# Patient Record
Sex: Female | Born: 1970
Health system: Southern US, Community
[De-identification: ages and names within clinical notes are randomized; demographics above are authoritative.]

## PROBLEM LIST (undated history)

## (undated) DIAGNOSIS — G902 Horner's syndrome: Secondary | ICD-10-CM

## (undated) DIAGNOSIS — D3610 Benign neoplasm of peripheral nerves and autonomic nervous system, unspecified: Secondary | ICD-10-CM

## (undated) DIAGNOSIS — Z87891 Personal history of nicotine dependence: Secondary | ICD-10-CM

## (undated) DIAGNOSIS — N921 Excessive and frequent menstruation with irregular cycle: Secondary | ICD-10-CM

## (undated) DIAGNOSIS — N760 Acute vaginitis: Secondary | ICD-10-CM

## (undated) DIAGNOSIS — D649 Anemia, unspecified: Secondary | ICD-10-CM

## (undated) DIAGNOSIS — D219 Benign neoplasm of connective and other soft tissue, unspecified: Secondary | ICD-10-CM

## (undated) DIAGNOSIS — Z8669 Personal history of other diseases of the nervous system and sense organs: Secondary | ICD-10-CM

## (undated) DIAGNOSIS — F329 Major depressive disorder, single episode, unspecified: Secondary | ICD-10-CM

## (undated) DIAGNOSIS — F32A Depression, unspecified: Secondary | ICD-10-CM

## (undated) DIAGNOSIS — B9689 Other specified bacterial agents as the cause of diseases classified elsewhere: Secondary | ICD-10-CM

## (undated) HISTORY — DX: Acute vaginitis: N76.0

## (undated) HISTORY — DX: Excessive and frequent menstruation with irregular cycle: N92.1

## (undated) HISTORY — DX: Anemia, unspecified: D64.9

## (undated) HISTORY — DX: Acute vaginitis: B96.89

## (undated) HISTORY — PX: TUMOR REMOVAL: SHX12

## (undated) HISTORY — DX: Personal history of other diseases of the nervous system and sense organs: Z86.69

## (undated) HISTORY — PX: OOPHORECTOMY: SHX86

## (undated) HISTORY — PX: ABDOMINOPLASTY: SUR9

## (undated) HISTORY — DX: Benign neoplasm of connective and other soft tissue, unspecified: D21.9

## (undated) HISTORY — DX: Horner's syndrome: G90.2

## (undated) HISTORY — DX: Benign neoplasm of peripheral nerves and autonomic nervous system, unspecified: D36.10

## (undated) HISTORY — DX: Major depressive disorder, single episode, unspecified: F32.9

## (undated) HISTORY — DX: Depression, unspecified: F32.A

## (undated) HISTORY — DX: Personal history of nicotine dependence: Z87.891

---

## 2000-11-20 ENCOUNTER — Other Ambulatory Visit: Admission: RE | Admit: 2000-11-20 | Discharge: 2000-11-20 | Payer: Self-pay | Admitting: *Deleted

## 2000-11-25 ENCOUNTER — Inpatient Hospital Stay (HOSPITAL_COMMUNITY): Admission: AD | Admit: 2000-11-25 | Discharge: 2000-11-25 | Payer: Self-pay | Admitting: Obstetrics

## 2000-12-31 ENCOUNTER — Encounter: Admission: RE | Admit: 2000-12-31 | Discharge: 2000-12-31 | Payer: Self-pay | Admitting: Obstetrics

## 2001-12-30 ENCOUNTER — Inpatient Hospital Stay (HOSPITAL_COMMUNITY): Admission: AD | Admit: 2001-12-30 | Discharge: 2001-12-30 | Payer: Self-pay | Admitting: *Deleted

## 2004-02-08 ENCOUNTER — Ambulatory Visit: Payer: Self-pay | Admitting: Internal Medicine

## 2004-02-15 ENCOUNTER — Ambulatory Visit: Payer: Self-pay | Admitting: Internal Medicine

## 2004-07-11 ENCOUNTER — Ambulatory Visit: Payer: Self-pay | Admitting: Internal Medicine

## 2004-12-11 ENCOUNTER — Emergency Department (HOSPITAL_COMMUNITY): Admission: EM | Admit: 2004-12-11 | Discharge: 2004-12-11 | Payer: Self-pay | Admitting: Emergency Medicine

## 2004-12-16 ENCOUNTER — Ambulatory Visit: Payer: Self-pay | Admitting: Internal Medicine

## 2004-12-25 ENCOUNTER — Ambulatory Visit: Payer: Self-pay | Admitting: Internal Medicine

## 2005-06-20 ENCOUNTER — Ambulatory Visit: Payer: Self-pay | Admitting: Internal Medicine

## 2005-07-11 ENCOUNTER — Encounter (INDEPENDENT_AMBULATORY_CARE_PROVIDER_SITE_OTHER): Payer: Self-pay | Admitting: Internal Medicine

## 2005-07-15 ENCOUNTER — Ambulatory Visit: Payer: Self-pay | Admitting: Internal Medicine

## 2005-08-18 ENCOUNTER — Ambulatory Visit: Payer: Self-pay | Admitting: Internal Medicine

## 2005-08-20 ENCOUNTER — Ambulatory Visit: Payer: Self-pay | Admitting: Internal Medicine

## 2005-08-25 ENCOUNTER — Encounter (INDEPENDENT_AMBULATORY_CARE_PROVIDER_SITE_OTHER): Payer: Self-pay | Admitting: Internal Medicine

## 2005-08-25 ENCOUNTER — Ambulatory Visit: Payer: Self-pay | Admitting: Internal Medicine

## 2005-08-29 ENCOUNTER — Ambulatory Visit: Payer: Self-pay | Admitting: Internal Medicine

## 2005-09-09 ENCOUNTER — Ambulatory Visit: Payer: Self-pay | Admitting: Internal Medicine

## 2005-09-10 ENCOUNTER — Encounter (HOSPITAL_COMMUNITY): Admission: RE | Admit: 2005-09-10 | Discharge: 2005-12-09 | Payer: Self-pay | Admitting: Family Medicine

## 2005-09-19 ENCOUNTER — Ambulatory Visit: Payer: Self-pay | Admitting: Internal Medicine

## 2005-09-24 ENCOUNTER — Ambulatory Visit (HOSPITAL_COMMUNITY): Admission: RE | Admit: 2005-09-24 | Discharge: 2005-09-24 | Payer: Self-pay | Admitting: Obstetrics & Gynecology

## 2005-09-29 ENCOUNTER — Encounter (INDEPENDENT_AMBULATORY_CARE_PROVIDER_SITE_OTHER): Payer: Self-pay | Admitting: Internal Medicine

## 2005-10-01 ENCOUNTER — Ambulatory Visit: Payer: Self-pay | Admitting: Internal Medicine

## 2005-10-02 ENCOUNTER — Ambulatory Visit: Payer: Self-pay | Admitting: Internal Medicine

## 2005-10-31 ENCOUNTER — Ambulatory Visit: Payer: Self-pay | Admitting: Internal Medicine

## 2005-11-20 ENCOUNTER — Ambulatory Visit: Payer: Self-pay | Admitting: Obstetrics and Gynecology

## 2006-03-17 ENCOUNTER — Encounter (INDEPENDENT_AMBULATORY_CARE_PROVIDER_SITE_OTHER): Payer: Self-pay | Admitting: Internal Medicine

## 2006-03-17 DIAGNOSIS — F329 Major depressive disorder, single episode, unspecified: Secondary | ICD-10-CM

## 2006-03-17 DIAGNOSIS — D509 Iron deficiency anemia, unspecified: Secondary | ICD-10-CM | POA: Insufficient documentation

## 2006-03-17 DIAGNOSIS — F32A Depression, unspecified: Secondary | ICD-10-CM | POA: Insufficient documentation

## 2006-06-08 ENCOUNTER — Encounter (INDEPENDENT_AMBULATORY_CARE_PROVIDER_SITE_OTHER): Payer: Self-pay | Admitting: Internal Medicine

## 2006-06-08 ENCOUNTER — Ambulatory Visit: Payer: Self-pay | Admitting: Internal Medicine

## 2006-06-08 LAB — CONVERTED CEMR LAB
CO2: 22 meq/L (ref 19–32)
Creatinine, Ser: 0.91 mg/dL (ref 0.40–1.20)
HCT: 33.3 % — ABNORMAL LOW (ref 36.0–46.0)
Hemoglobin: 11 g/dL — ABNORMAL LOW (ref 12.0–15.0)
MCHC: 33 g/dL (ref 30.0–36.0)
MCV: 84.5 fL (ref 78.0–100.0)
Platelets: 315 10*3/uL (ref 150–400)
RBC: 3.94 M/uL (ref 3.87–5.11)
Sodium: 137 meq/L (ref 135–145)

## 2006-07-06 ENCOUNTER — Encounter (INDEPENDENT_AMBULATORY_CARE_PROVIDER_SITE_OTHER): Payer: Self-pay | Admitting: Internal Medicine

## 2006-07-06 ENCOUNTER — Ambulatory Visit: Payer: Self-pay | Admitting: Internal Medicine

## 2006-07-06 DIAGNOSIS — J069 Acute upper respiratory infection, unspecified: Secondary | ICD-10-CM | POA: Insufficient documentation

## 2006-07-08 LAB — CONVERTED CEMR LAB
Hemoglobin: 10.8 g/dL — ABNORMAL LOW (ref 12.0–15.0)
RDW: 15.6 % — ABNORMAL HIGH (ref 11.5–14.0)

## 2006-07-13 ENCOUNTER — Encounter (INDEPENDENT_AMBULATORY_CARE_PROVIDER_SITE_OTHER): Payer: Self-pay | Admitting: Internal Medicine

## 2006-07-13 ENCOUNTER — Ambulatory Visit: Payer: Self-pay | Admitting: Internal Medicine

## 2006-07-13 LAB — CONVERTED CEMR LAB: RBC Folate: 566 ng/mL (ref 180–600)

## 2006-07-14 LAB — CONVERTED CEMR LAB
Ferritin: 23 ng/mL (ref 10–291)
Vitamin B-12: 1018 pg/mL — ABNORMAL HIGH (ref 211–911)

## 2006-11-09 ENCOUNTER — Ambulatory Visit: Payer: Self-pay | Admitting: Internal Medicine

## 2006-11-09 ENCOUNTER — Encounter (INDEPENDENT_AMBULATORY_CARE_PROVIDER_SITE_OTHER): Payer: Self-pay | Admitting: Internal Medicine

## 2006-11-09 DIAGNOSIS — IMO0002 Reserved for concepts with insufficient information to code with codable children: Secondary | ICD-10-CM | POA: Insufficient documentation

## 2006-11-10 DIAGNOSIS — N921 Excessive and frequent menstruation with irregular cycle: Secondary | ICD-10-CM | POA: Insufficient documentation

## 2006-11-10 LAB — CONVERTED CEMR LAB
HCT: 34.6 % — ABNORMAL LOW
Hemoglobin: 10.7 g/dL — ABNORMAL LOW
MCHC: 30.9 g/dL
MCV: 84.8 fL
Platelets: 302 K/uL
RBC: 4.08 M/uL
RDW: 14.9 % — ABNORMAL HIGH
WBC: 7 10*3/microliter

## 2006-11-23 ENCOUNTER — Ambulatory Visit: Payer: Self-pay | Admitting: Internal Medicine

## 2006-11-23 ENCOUNTER — Encounter (INDEPENDENT_AMBULATORY_CARE_PROVIDER_SITE_OTHER): Payer: Self-pay | Admitting: Internal Medicine

## 2006-11-23 LAB — CONVERTED CEMR LAB
HCT: 37.4 % (ref 36.0–46.0)
Hemoglobin: 11.2 g/dL — ABNORMAL LOW (ref 12.0–15.0)
MCV: 86.4 fL (ref 78.0–100.0)
Platelets: 371 10*3/uL (ref 150–400)
RBC: 4.33 M/uL (ref 3.87–5.11)
RDW: 15 % — ABNORMAL HIGH (ref 11.5–14.0)

## 2006-12-04 ENCOUNTER — Ambulatory Visit: Payer: Self-pay | Admitting: Obstetrics & Gynecology

## 2006-12-04 ENCOUNTER — Encounter: Payer: Self-pay | Admitting: Obstetrics & Gynecology

## 2006-12-07 ENCOUNTER — Ambulatory Visit (HOSPITAL_COMMUNITY): Admission: RE | Admit: 2006-12-07 | Discharge: 2006-12-07 | Payer: Self-pay | Admitting: Family Medicine

## 2006-12-09 ENCOUNTER — Ambulatory Visit: Payer: Self-pay | Admitting: Gynecology

## 2007-03-29 ENCOUNTER — Encounter (INDEPENDENT_AMBULATORY_CARE_PROVIDER_SITE_OTHER): Payer: Self-pay | Admitting: *Deleted

## 2007-03-29 ENCOUNTER — Ambulatory Visit: Payer: Self-pay | Admitting: Infectious Diseases

## 2007-03-29 ENCOUNTER — Ambulatory Visit (HOSPITAL_COMMUNITY): Admission: RE | Admit: 2007-03-29 | Discharge: 2007-03-29 | Payer: Self-pay | Admitting: Infectious Diseases

## 2007-03-29 DIAGNOSIS — R519 Headache, unspecified: Secondary | ICD-10-CM | POA: Insufficient documentation

## 2007-03-29 DIAGNOSIS — R51 Headache: Secondary | ICD-10-CM | POA: Insufficient documentation

## 2007-03-29 LAB — CONVERTED CEMR LAB
Basophils Absolute: 0 10*3/uL (ref 0.0–0.1)
Eosinophils Absolute: 0.1 10*3/uL (ref 0.0–0.7)
Hemoglobin: 11.9 g/dL — ABNORMAL LOW (ref 12.0–15.0)
Lymphocytes Relative: 30 % (ref 12–46)
Lymphs Abs: 2.1 10*3/uL (ref 0.7–3.3)
MCHC: 32.5 g/dL (ref 30.0–36.0)
Platelets: 268 10*3/uL (ref 150–400)
RBC: 4.29 M/uL (ref 3.87–5.11)
RDW: 17.8 % — ABNORMAL HIGH (ref 11.5–14.0)
WBC: 7 10*3/uL (ref 4.0–10.5)

## 2007-04-08 ENCOUNTER — Encounter (INDEPENDENT_AMBULATORY_CARE_PROVIDER_SITE_OTHER): Payer: Self-pay | Admitting: *Deleted

## 2007-04-08 ENCOUNTER — Ambulatory Visit: Payer: Self-pay | Admitting: *Deleted

## 2007-04-08 ENCOUNTER — Ambulatory Visit (HOSPITAL_COMMUNITY): Admission: RE | Admit: 2007-04-08 | Discharge: 2007-04-08 | Payer: Self-pay | Admitting: *Deleted

## 2007-04-08 LAB — CONVERTED CEMR LAB
ALT: 13 units/L (ref 0–35)
Albumin: 3.5 g/dL (ref 3.5–5.2)
Alkaline Phosphatase: 86 units/L (ref 39–117)
BUN: 12 mg/dL (ref 6–23)
Basophils Absolute: 0 10*3/uL (ref 0.0–0.1)
Basophils Relative: 0 % (ref 0–1)
Beta hcg, urine, semiquantitative: NEGATIVE
Bilirubin Urine: NEGATIVE
CO2: 25 meq/L (ref 19–32)
Creatinine, Ser: 0.73 mg/dL (ref 0.40–1.20)
Eosinophils Absolute: 0.1 10*3/uL (ref 0.0–0.7)
Glucose, Bld: 94 mg/dL (ref 70–99)
Ketones, ur: NEGATIVE mg/dL
Monocytes Absolute: 0.4 10*3/uL (ref 0.2–0.7)
Neutrophils Relative %: 70 % (ref 43–77)
Nitrite: NEGATIVE
Potassium: 4.1 meq/L (ref 3.5–5.3)
Total Protein: 7.6 g/dL (ref 6.0–8.3)
WBC: 8.4 10*3/uL (ref 4.0–10.5)

## 2007-04-21 ENCOUNTER — Encounter (INDEPENDENT_AMBULATORY_CARE_PROVIDER_SITE_OTHER): Payer: Self-pay | Admitting: *Deleted

## 2007-04-21 ENCOUNTER — Encounter (INDEPENDENT_AMBULATORY_CARE_PROVIDER_SITE_OTHER): Payer: Self-pay | Admitting: Internal Medicine

## 2007-04-21 ENCOUNTER — Ambulatory Visit: Payer: Self-pay | Admitting: Internal Medicine

## 2007-04-21 LAB — CONVERTED CEMR LAB
Bilirubin Urine: NEGATIVE
Ketones, ur: NEGATIVE mg/dL
Nitrite: NEGATIVE
Protein, ur: NEGATIVE mg/dL

## 2007-10-11 ENCOUNTER — Encounter: Payer: Self-pay | Admitting: Internal Medicine

## 2007-10-11 ENCOUNTER — Ambulatory Visit: Payer: Self-pay | Admitting: Internal Medicine

## 2007-10-11 DIAGNOSIS — R5383 Other fatigue: Secondary | ICD-10-CM | POA: Insufficient documentation

## 2007-10-11 DIAGNOSIS — Z9189 Other specified personal risk factors, not elsewhere classified: Secondary | ICD-10-CM | POA: Insufficient documentation

## 2007-10-11 DIAGNOSIS — R5381 Other malaise: Secondary | ICD-10-CM | POA: Insufficient documentation

## 2007-10-11 LAB — CONVERTED CEMR LAB
BUN: 15 mg/dL (ref 6–23)
CO2: 23 meq/L (ref 19–32)
Creatinine, Ser: 0.62 mg/dL (ref 0.40–1.20)
Glucose, Bld: 90 mg/dL (ref 70–99)
HCT: 38.4 % (ref 36.0–46.0)
Hemoglobin: 11.7 g/dL — ABNORMAL LOW (ref 12.0–15.0)
MCV: 90.1 fL (ref 78.0–100.0)
Platelets: 319 10*3/uL (ref 150–400)
Sodium: 137 meq/L (ref 135–145)

## 2007-10-25 ENCOUNTER — Ambulatory Visit: Payer: Self-pay | Admitting: *Deleted

## 2007-11-19 ENCOUNTER — Ambulatory Visit: Payer: Self-pay | Admitting: Obstetrics & Gynecology

## 2007-11-24 ENCOUNTER — Ambulatory Visit (HOSPITAL_COMMUNITY): Admission: RE | Admit: 2007-11-24 | Discharge: 2007-11-24 | Payer: Self-pay | Admitting: Family Medicine

## 2007-11-29 ENCOUNTER — Ambulatory Visit: Payer: Self-pay | Admitting: Internal Medicine

## 2007-12-17 ENCOUNTER — Telehealth: Payer: Self-pay | Admitting: Internal Medicine

## 2008-01-12 ENCOUNTER — Encounter: Payer: Self-pay | Admitting: Obstetrics and Gynecology

## 2008-01-12 ENCOUNTER — Ambulatory Visit: Payer: Self-pay | Admitting: Obstetrics and Gynecology

## 2008-04-26 ENCOUNTER — Ambulatory Visit: Payer: Self-pay | Admitting: Obstetrics & Gynecology

## 2008-06-20 ENCOUNTER — Inpatient Hospital Stay (HOSPITAL_COMMUNITY): Admission: RE | Admit: 2008-06-20 | Discharge: 2008-06-21 | Payer: Self-pay | Admitting: Obstetrics & Gynecology

## 2008-06-20 ENCOUNTER — Encounter: Payer: Self-pay | Admitting: Obstetrics & Gynecology

## 2008-06-20 ENCOUNTER — Ambulatory Visit: Payer: Self-pay | Admitting: Obstetrics & Gynecology

## 2008-07-20 ENCOUNTER — Ambulatory Visit: Payer: Self-pay | Admitting: Obstetrics & Gynecology

## 2008-07-20 LAB — CONVERTED CEMR LAB: FSH: 2.4 milliintl units/mL

## 2008-07-21 ENCOUNTER — Encounter: Payer: Self-pay | Admitting: Family

## 2008-07-21 LAB — CONVERTED CEMR LAB
Hemoglobin, Urine: NEGATIVE
Leukocytes, UA: NEGATIVE
Nitrite: NEGATIVE
Protein, ur: NEGATIVE mg/dL
Urine Glucose: NEGATIVE mg/dL
Urobilinogen, UA: 1 (ref 0.0–1.0)

## 2008-12-14 ENCOUNTER — Ambulatory Visit: Payer: Self-pay | Admitting: Internal Medicine

## 2008-12-14 ENCOUNTER — Encounter: Payer: Self-pay | Admitting: Internal Medicine

## 2008-12-14 DIAGNOSIS — D3610 Benign neoplasm of peripheral nerves and autonomic nervous system, unspecified: Secondary | ICD-10-CM | POA: Insufficient documentation

## 2008-12-14 HISTORY — DX: Benign neoplasm of peripheral nerves and autonomic nervous system, unspecified: D36.10

## 2008-12-14 LAB — CONVERTED CEMR LAB
AST: 15 units/L (ref 0–37)
Albumin: 4.1 g/dL (ref 3.5–5.2)
Alkaline Phosphatase: 102 units/L (ref 39–117)
BUN: 14 mg/dL (ref 6–23)
Calcium: 9.3 mg/dL (ref 8.4–10.5)
Creatinine, Ser: 0.63 mg/dL (ref 0.40–1.20)
Eosinophils Relative: 2 % (ref 0–5)
Glucose, Bld: 89 mg/dL (ref 70–99)
HCT: 32.8 % — ABNORMAL LOW (ref 36.0–46.0)
Hemoglobin: 10.1 g/dL — ABNORMAL LOW (ref 12.0–15.0)
Leukocytes, UA: NEGATIVE
Lymphocytes Relative: 28 % (ref 12–46)
MCHC: 30.8 g/dL (ref 30.0–36.0)
Monocytes Absolute: 0.5 10*3/uL (ref 0.1–1.0)
Monocytes Relative: 6 % (ref 3–12)
Neutro Abs: 5.3 10*3/uL (ref 1.7–7.7)
Nitrite: NEGATIVE
Potassium: 4.5 meq/L (ref 3.5–5.3)
Protein, ur: NEGATIVE mg/dL
RBC: 4.25 M/uL (ref 3.87–5.11)
Sed Rate: 31 mm/hr — ABNORMAL HIGH (ref 0–22)
Specific Gravity, Urine: 1.022 (ref 1.005–1.030)
TSH: 0.98 microintl units/mL (ref 0.350–4.500)
Urobilinogen, UA: 0.2 (ref 0.0–1.0)

## 2008-12-15 LAB — CONVERTED CEMR LAB: Candida species: POSITIVE — AB

## 2008-12-20 ENCOUNTER — Ambulatory Visit (HOSPITAL_COMMUNITY): Admission: RE | Admit: 2008-12-20 | Discharge: 2008-12-20 | Payer: Self-pay | Admitting: Internal Medicine

## 2008-12-22 ENCOUNTER — Ambulatory Visit: Payer: Self-pay | Admitting: Internal Medicine

## 2008-12-22 ENCOUNTER — Encounter: Payer: Self-pay | Admitting: Internal Medicine

## 2009-01-10 ENCOUNTER — Encounter: Payer: Self-pay | Admitting: Internal Medicine

## 2009-01-10 ENCOUNTER — Ambulatory Visit: Payer: Self-pay | Admitting: Internal Medicine

## 2009-02-01 ENCOUNTER — Encounter: Payer: Self-pay | Admitting: Internal Medicine

## 2009-03-28 ENCOUNTER — Ambulatory Visit: Payer: Self-pay | Admitting: Internal Medicine

## 2009-03-28 ENCOUNTER — Telehealth: Payer: Self-pay | Admitting: *Deleted

## 2009-03-28 DIAGNOSIS — N898 Other specified noninflammatory disorders of vagina: Secondary | ICD-10-CM | POA: Insufficient documentation

## 2009-03-29 DIAGNOSIS — B373 Candidiasis of vulva and vagina: Secondary | ICD-10-CM | POA: Insufficient documentation

## 2009-03-29 LAB — CONVERTED CEMR LAB: Candida species: POSITIVE — AB

## 2009-04-04 ENCOUNTER — Ambulatory Visit (HOSPITAL_COMMUNITY): Admission: RE | Admit: 2009-04-04 | Discharge: 2009-04-04 | Payer: Self-pay | Admitting: Internal Medicine

## 2009-04-20 ENCOUNTER — Telehealth: Payer: Self-pay | Admitting: Internal Medicine

## 2009-04-20 ENCOUNTER — Emergency Department (HOSPITAL_COMMUNITY): Admission: EM | Admit: 2009-04-20 | Discharge: 2009-04-20 | Payer: Self-pay | Admitting: Emergency Medicine

## 2009-06-20 ENCOUNTER — Encounter: Payer: Self-pay | Admitting: Internal Medicine

## 2009-06-20 HISTORY — PX: OTHER SURGICAL HISTORY: SHX169

## 2009-08-20 ENCOUNTER — Encounter: Admission: RE | Admit: 2009-08-20 | Discharge: 2009-10-24 | Payer: Self-pay | Admitting: Student

## 2009-10-04 ENCOUNTER — Ambulatory Visit: Payer: Self-pay | Admitting: Obstetrics & Gynecology

## 2009-10-09 ENCOUNTER — Telehealth: Payer: Self-pay | Admitting: Internal Medicine

## 2009-10-22 ENCOUNTER — Ambulatory Visit (HOSPITAL_COMMUNITY): Admission: RE | Admit: 2009-10-22 | Discharge: 2009-10-22 | Payer: Self-pay | Admitting: Obstetrics & Gynecology

## 2009-10-22 LAB — HM MAMMOGRAPHY: HM Mammogram: NEGATIVE

## 2009-10-30 ENCOUNTER — Encounter: Admission: RE | Admit: 2009-10-30 | Discharge: 2009-10-30 | Payer: Self-pay | Admitting: Obstetrics & Gynecology

## 2010-02-28 ENCOUNTER — Encounter: Payer: Self-pay | Admitting: Internal Medicine

## 2010-03-07 ENCOUNTER — Ambulatory Visit: Payer: Self-pay | Admitting: Internal Medicine

## 2010-03-07 DIAGNOSIS — K117 Disturbances of salivary secretion: Secondary | ICD-10-CM | POA: Insufficient documentation

## 2010-03-07 DIAGNOSIS — J029 Acute pharyngitis, unspecified: Secondary | ICD-10-CM | POA: Insufficient documentation

## 2010-03-07 DIAGNOSIS — H04129 Dry eye syndrome of unspecified lacrimal gland: Secondary | ICD-10-CM | POA: Insufficient documentation

## 2010-03-07 HISTORY — DX: Disturbances of salivary secretion: K11.7

## 2010-03-14 LAB — CONVERTED CEMR LAB
Anti Nuclear Antibody(ANA): POSITIVE — AB
Basophils Absolute: 0 10*3/uL (ref 0.0–0.1)
Basophils Relative: 1 % (ref 0–1)
Eosinophils Absolute: 0.1 10*3/uL (ref 0.0–0.7)
MCHC: 31.8 g/dL (ref 30.0–36.0)
MCV: 88.2 fL (ref >=78.0)
Monocytes Relative: 6 % (ref 3–12)
Neutrophils Relative %: 58 % (ref 43–77)
Platelets: 311 10*3/uL (ref 150–400)
RDW: 15.1 % (ref 11.5–15.5)
Sed Rate: 3 mm/hr (ref 0–22)

## 2010-03-15 ENCOUNTER — Telehealth: Payer: Self-pay | Admitting: *Deleted

## 2010-04-03 ENCOUNTER — Ambulatory Visit: Payer: Self-pay | Admitting: Internal Medicine

## 2010-04-03 ENCOUNTER — Encounter: Payer: Self-pay | Admitting: Licensed Clinical Social Worker

## 2010-04-03 DIAGNOSIS — F410 Panic disorder [episodic paroxysmal anxiety] without agoraphobia: Secondary | ICD-10-CM | POA: Insufficient documentation

## 2010-04-03 LAB — CONVERTED CEMR LAB
Albumin: 4.2 g/dL (ref 3.5–5.2)
Alkaline Phosphatase: 101 units/L (ref 39–117)
BUN: 13 mg/dL (ref 6–23)
Glucose, Bld: 83 mg/dL (ref 70–99)
HDL: 55 mg/dL (ref >39)
LDL Cholesterol: 131 mg/dL — ABNORMAL HIGH (ref 0–99)
Potassium: 4.2 meq/L (ref 3.5–5.3)
Triglycerides: 147 mg/dL (ref <150)

## 2010-04-04 ENCOUNTER — Telehealth: Payer: Self-pay | Admitting: Internal Medicine

## 2010-04-04 ENCOUNTER — Encounter: Payer: Self-pay | Admitting: Internal Medicine

## 2010-04-11 ENCOUNTER — Telehealth: Payer: Self-pay | Admitting: *Deleted

## 2010-04-19 ENCOUNTER — Encounter: Payer: Self-pay | Admitting: Licensed Clinical Social Worker

## 2010-04-19 ENCOUNTER — Ambulatory Visit: Payer: Self-pay | Admitting: Internal Medicine

## 2010-04-22 ENCOUNTER — Encounter: Payer: Self-pay | Admitting: Internal Medicine

## 2010-05-20 ENCOUNTER — Ambulatory Visit: Payer: Self-pay | Admitting: Internal Medicine

## 2010-06-19 ENCOUNTER — Ambulatory Visit: Admit: 2010-06-19 | Payer: Self-pay

## 2010-07-09 NOTE — Progress Notes (Signed)
----   Converted from flag ---- ---- 03/12/2010 6:21 PM, Melida Quitter MD wrote: Rip Harbour I will put in a rheum referral.   ---- 03/12/2010 3:19 PM, Chinita Pester RN wrote: No  ---- 03/12/2010 11:09 AM, Chinita Pester RN wrote: [facility] does have a Rheumatology dept.; I will need a referral order.Her ENT appt. was 03/08/10 @1115am .  And the Ophthalmology record is in your box at the clinic.  ---- 03/12/2010 7:50 AM, Melida Quitter MD wrote: Teressa Lower,  For the patient we saw last week Lequita Asal, her labs came back as abnormal. She needs a rheumatology referral for further evaluation, however she does not have insurance. Can we set something up at [facility], since she will go there to see the ENT? When is her appt with ENT? Did we get her Opthamology appt records yet?   Thank you! ------------------------------

## 2010-07-09 NOTE — Progress Notes (Signed)
Summary: pt, speech and facial changes/ hla  Phone Note Call from Patient   Summary of Call: [facility] pt/ rehab calls to say pt presented there today with facial drooping on one side and some speech difficulties, ask ifher appearance and speech  were  different than normal, pt states yes, they are instructed to send pt to ed now. Initial call taken by: Marin Roberts RN,  Oct 09, 2009 2:42 PM  Follow-up for Phone Call        Agree with eval in ED.  Thanks Follow-up by: Mariea Stable MD,  Oct 09, 2009 10:18 PM

## 2010-07-09 NOTE — Assessment & Plan Note (Signed)
Summary: Soc. Work  2 hours.  Social Work.  Referred by Dr. Onalee Hua for suicidal ideation. Counseling, support, and connection to Mental Health. Patient with multiple health issues, low financial resources, family issues, status issues, 70 hour work week.   Depression and anxiety.   makes 7.50 per hours.  Has own home and husband lives in own apartment.  Goes back and forth between two places. Two of her adult children live with her. She has an Pensions consultant and working on Psychologist, occupational English at [employer]    Married with three adult children.  Son has had multiple auto ax and she is stressed out about him and the cost of his auto insurance and legal issues/63 year old son does work.   Strained relationship with mother in [place]/does not feel supported by mother. Older daughter has three kids.  She feels pressure to babysit her grandchildren even though she does not have time even for herself except for Sundays.  Older daughter does not understand.    Time spent listening to her story, counseling her, multiple phone calls to several mental health agencies.  [facility] is no longer serving undocumented population. [facility] does not have availability until after Jan. 1 and she would have to participate in counseling along with psychiatry. She likely cannot fit weekly counseling into her schedule and so that would not be a viable option.   Paged Dr. Mervyn Skeeters to let him know situation and he will call in script for Prozac today or tomorrow but not comfortable prescribing anti-anxiety due to SI.  Pharmacy is [facility] [address].   In the meantime, I will try and contact other agencies in anticipation of future need for psychiatry/counseling.  [facility] may be an option after the 1st of the year.  Message in to [facility] to seek advice re: status and available services.  SW f/u.  Patient has appmt in two weeks with Dr. Dorthula Rue as Dr. Roberts Gaudy schedule was booked.

## 2010-07-09 NOTE — Assessment & Plan Note (Signed)
Summary: Soc. Work  30 minutes.  Soc. Work.  Visit with patient.  Interpreter was present. The patient reports to Soc. Work that she is doing very well and saw an improvement in her mood after the first week of medications.  She particularly reported that she had a great yoga session and that her family was doing well.  She discussed with Dr. Dorthula Rue and myself that she would be likely going back to [place] to straighten out issues involving her status and she wanted the physician to refill her medications  so her daughter could mail to [place]. She was vague and not clear as to when she would be going home to [place] for a visit.  Dr. Dorthula Rue explained that this would not be possible and that we needed to see her in another month to review her progress and refill her MH meds as appropriate.   Fam. Services counseling was discussed with the patient in that they would serve her and also offer services in Spanish.  I offered to help her make a call for an appmt today but she was not receptive to that option because of thoughts that she might go home to [place].   A brochure was provided to her for informational purposes.

## 2010-07-09 NOTE — Consult Note (Signed)
Summary: [facility]   Imported By: Florinda Marker 06/21/2009 14:57:04  _____________________________________________________________________  External Attachment:    Type:   Image     Comment:   External Document  Appended Document: [facility]    Clinical Lists Changes        Problem # 13:  SWELLING MASS OR LUMP IN HEAD AND NECK (ICD-784.2) Benign Schwannoma  Complete Medication List: 1)  Iron 325 (65 Fe) Mg Tabs (Ferrous sulfate) .... Take 1 tablet by mouth once a day 2)  Tramadol Hcl 50 Mg Tabs (Tramadol hcl) .... Take 1 tablet by mouth every 8 hours as needed for pain. 3)  Fluconazole 150 Mg Tabs (Fluconazole) .... Take 1 tablet today and take the other tablet in 3days.

## 2010-07-09 NOTE — Assessment & Plan Note (Signed)
Summary: EST-1 MONTH F/U VISIT/CH   Vital Signs:  Patient profile:   40 year old female Height:      63 inches (160.02 cm) Weight:      153 pounds (69.55 kg) BMI:     27.20 Temp:     97.6 degrees F (36.44 degrees C) oral Pulse rate:   78 / minute BP sitting:   109 / 71  (right arm) Cuff size:   regular  Vitals Entered By: Angelina Ok RN (April 03, 2010 10:13 AM) CC: Depression Is Patient Diabetic? No Pain Assessment Patient in pain? no      Nutritional Status BMI of 25 - 29 = overweight  Have you ever been in a relationship where you felt threatened, hurt or afraid?No   Does patient need assistance? Functional Status Self care Ambulation Normal Comments Check up.  Results and what to do next.   Primary Care Provider:  Mariea Stable MD  CC:  Depression.  History of Present Illness: Lori Murray is a 40 yo woman with PMH as outlined below.  She is here for routine follow up.  I have not seen her in about 2 years though has been seen in our clinic by other providers in that time frame.  More importantly, she had a mass that was evaluated and excised which resulted to be a benign schwanomma.  Since, she has developed Horner's syndrome.  There is also some evidence for possible sjogren syndrome.  She has been evaluated by [facility] eye center/cornea recently (see documentation) as well as ENT and rheumatology since (records not recieved yet).  Aside from her symptoms associated with Horner's syndrome, she verbalized fatigue to the rheumatologist.  She states they ordered blood work and urine samples but is not sure of the results.  She was apparently told that the results would be sent here and is unaware of need for follow up with them.  Upon further discussion, pt reports feeling short of breath at times.  May occur at work, while driving or while in bed trying to go to sleep.  She describes feelings of impedning doom, space closing in on her, and sensation of choking.   Symptoms are of brief duration and associated with significant anxiety.    She has also had issues with depression her whole life with 3 suicide attempts in past.  She had tried pills and cutting without success.  She reports some suicidal ideation that is not new.  States she has had about 3 attempts in past including pills and cutting that were unsuccessful.  States that she has thought of attempting it again as she feels no one is listening and there is no way out of her current situation.  Upon further discussion, she states she will not "do anything" meaning she will not attempt suicide.  She is agreeable to meet with SW and see psychiatry upon referral.  She is also agreeable to call clinic, hotline, 911/ER if suicidal ideation occurs.    Depression History:      The patient is having a depressed mood most of the day but denies diminished interest in her usual daily activities.        Comments:  Lot's of stress.  Tired.  ? something for.   Preventive Screening-Counseling & Management  Alcohol-Tobacco     Alcohol drinks/day: 0     Smoking Status: quit     Smoking Cessation Counseling: yes     Packs/Day: 1-2     Year Quit:  1 YR AG0  Current Medications (verified): 1)  Systane Ultra 0.4-0.3 % Soln (Polyethyl Glycol-Propyl Glycol) .Marland Kitchen.. 1-2 Drops in Each Eye As Needed 2)  Biotene Moisturizing Mouth  Soln (Artificial Saliva) .... Spray in Mouth As Needed  Allergies (verified): No Known Drug Allergies  Past History:  Social History: Last updated: 03/07/2010 Working part time at [employer] Former Smoker Regular exercise-yes  Risk Factors: Smoking Status: quit (04/03/2010) Packs/Day: 1-2 (04/03/2010)  Past Medical History: H/O tension headache  History of tobacco use Recurrent Gardnerella Vaginalis infection h/o metrorhagia treated with OCP Fibroids Depression Iron deficiency anemia Horner's      Dry Eye Syndrome:  OS>OD 2/2 Horner's syndrome.  Followed at [facility]  (           Systane eye drops improved symptoms      LUL ptosis 2/2 Horner's:  f/u with Dr. Corinne Ports  Past Surgical History: excision of benign schwanomma left neck 06/20/09 by Dr. Reine Just [facility])  Review of Systems      See HPI  Physical Exam  General:  alert and well-developed.  VS reviewed and wnlalert and well-developed.   Eyes:  Left ptosis.  PERRL.  left eye not tearing as much as right.  EOMI.  anicteric Neck:  left neck remote scar. No JVD Lungs:  normal respiratory effort, no accessory muscle use, normal breath sounds, no crackles, and no wheezes.   Heart:  normal rate, regular rhythm, no murmur, no gallop, no rub, and no JVD.   Abdomen:  soft, non-tender, normal bowel sounds, no distention, no masses, and no guarding.   Neurologic:  alert & oriented X3, strength normal in all extremities, and gait normal.   Psych:  Oriented X3, memory intact for recent and remote, and depressed affect.  Tearful during interivew.  Currently not suicidal (see HPI).   Impression & Recommendations:  Problem # 1:  DEPRESSION, HX OF (ICD-V11.8) Please see HPI for details. Pt is agreeable to seek help if suicidal ideation recurrs. Discussed calling clinic, hotline, ER/911. Will put in for SW and psychiatry referrals.Marland Kitchen...will call and ensure appointment is set and pt aware. Spoke with Dorothe Pea who will go in and speak with pt prior to pt leaving.  Will try to get pt in through family services and set up with psych.  If not, will arrange appt with [facility].  >45 min face to face  Problem # 2:  FATIGUE (ICD-780.79) Reviewed recent labs, will check CMP Will need to review [facility] rheumatology labs...Marland KitchenMarland Kitchen? rheumatology component Assume there is a great psychiatric component to fatigue  Orders: T-Comprehensive Metabolic Panel ([id number])  Problem # 3:  DRY EYE SYNDROME (ICD-375.15) followed by [facility] eye center continue drops  Problem # 4:  HEALTH MAINTENANCE EXAM  (ICD-V70.0) flu shot today lipid panel ordered.  Problem # 5:  PANIC ATTACK (ICD-300.01) Had discussion with pt...Marland KitchenMarland Kitchenappears to have anxiety NOS with panic disorder Considered benzo's followed by SSRI but given h/o depression and suicide attempts had a lengthy discussion that led to problem #1 Will defer to psych for treatment options as I do not feel comfortable prescribing benzo (although rather safe) to pt with h/o SI and attempts including taking pills.  Complete Medication List: 1)  Systane Ultra 0.4-0.3 % Soln (Polyethyl glycol-propyl glycol) .Marland Kitchen.. 1-2 drops in each eye as needed 2)  Biotene Moisturizing Mouth Soln (Artificial saliva) .... Spray in mouth as needed  Other Orders: T-Lipid Profile [phone]) Social Work Referral (Social ) Psychiatric Referral (Psych)  Patient Instructions: 1)  Please schedule a follow-up appointment in 1 month. 2)  Please make sure to call clinic, hotline, 911 or go to emergency department if any thoughts of suicide as discussed. 3)  Will have you see Dorothe Pea, our social worker. 4)  Will get you referred to psychiatry as discussed. 5)  Will obtain records from [facility] rheumatologist and call you after reviewed. 6)  If you have any other problems before next appointment, call clinic.    Orders Added: 1)  T-Lipid Profile [[id number]] 2)  T-Comprehensive Metabolic Panel [[id number]] 3)  Est. Patient Level V [16109] 4)  Social Work Referral Blush.Krone ] 5)  Psychiatric Referral [Psych]    Prevention & Chronic Care Immunizations   Influenza vaccine: Not documented   Influenza vaccine deferral: Deferred  (12/22/2008)   Influenza vaccine due: 02/07/2009    Tetanus booster: Not documented   Td booster deferral: Deferred  (12/22/2008)    Pneumococcal vaccine: Not documented   Pneumococcal vaccine deferral: Deferred  (12/22/2008)  Other Screening   Pap smear: Normal  (08/25/2005)   Pap smear action/deferral: Not indicated S/P  hysterectomy  (12/14/2008)    Mammogram: 00202.0^MM DIGITAL SCREENING  (10/22/2009)   Smoking status: quit  (04/03/2010)  Lipids   Total Cholesterol: Not documented   Lipid panel action/deferral: Lipid Panel ordered   LDL: Not documented   LDL Direct: Not documented   HDL: Not documented   Triglycerides: Not documented   Nursing Instructions: Give Flu vaccine today     Vital Signs:  Patient profile:   40 year old female Height:      63 inches (160.02 cm) Weight:      153 pounds (69.55 kg) BMI:     27.20 Temp:     97.6 degrees F (36.44 degrees C) oral Pulse rate:   78 / minute BP sitting:   109 / 71  (right arm) Cuff size:   regular  Vitals Entered By: Angelina Ok RN (April 03, 2010 10:13 AM)   Process Orders Check Orders Results:     [facility]: ABN not required for this insurance Tests Sent for requisitioning (April 04, 2010 8:52 AM):     04/03/2010: [facility] -- T-Lipid Profile [phone] (signed)     04/03/2010: [facility] -- T-Comprehensive Metabolic Panel [phone] (signed)

## 2010-07-09 NOTE — Progress Notes (Signed)
  Phone Note Outgoing Call   Call placed by: Mariea Stable MD,  April 04, 2010 9:37 AM Call placed to: Patient Summary of Call: Called pt to discuss plan and medications as per update.  Pt is currently at work and doing well.  She understood plan and is agreeable.  Voiced appreciation for all that is being done.

## 2010-07-09 NOTE — Consult Note (Signed)
Summary: [facility]   Imported By: Margie Billet 03/15/2010 09:56:24  _____________________________________________________________________  External Attachment:    Type:   Image     Comment:   External Document  Appended Document: [facility]    Clinical Lists Changes  Observations: Added new observation of PAST MED HX: H/O tension headache  History of tobacco use Recurrent Gardnerella Vaginalis infection h/o metrorhagia treated with OCP Fibroids Depression Iron deficiency anemia Horner's      Dry Eye Syndrome:  OS>OD 2/2 Horner's syndrome.  Followed at [facility]           Systane eye drops improved symptoms      LUL ptosis 2/2 Horner's:  f/u with Dr. Corinne Ports (03/19/2010 10:19)       Complete Medication List: 1)  Systane Ultra 0.4-0.3 % Soln (Polyethyl glycol-propyl glycol) .Marland Kitchen.. 1-2 drops in each eye as needed 2)  Biotene Moisturizing Mouth Soln (Artificial saliva) .... Spray in mouth as needed   Past History:  Past Medical History: H/O tension headache  History of tobacco use Recurrent Gardnerella Vaginalis infection h/o metrorhagia treated with OCP Fibroids Depression Iron deficiency anemia Horner's      Dry Eye Syndrome:  OS>OD 2/2 Horner's syndrome.  Followed at [facility]           Systane eye drops improved symptoms      LUL ptosis 2/2 Horner's:  f/u with Dr. Corinne Ports

## 2010-07-09 NOTE — Assessment & Plan Note (Signed)
Summary: ACUTE/ALVAREZ/2 WEEK RECHECK/CH   Vital Signs:  Patient profile:   40 year old female Height:      63 inches (160.02 cm) Weight:      152.1 pounds (69.14 kg) BMI:     27.04 Temp:     97.0 degrees F (36.11 degrees C) oral Pulse rate:   70 / minute BP sitting:   104 / 70  (left arm) Cuff size:   large  Vitals Entered By: Cynda Familia Duncan Dull) (April 19, 2010 9:39 AM) CC: f/u depression Is Patient Diabetic? No Pain Assessment Patient in pain? no      Nutritional Status BMI of 25 - 29 = overweight  Have you ever been in a relationship where you felt threatened, hurt or afraid?No   Does patient need assistance? Functional Status Self care Ambulation Normal   Primary Care Provider:  Mariea Stable MD  CC:  f/u depression.  History of Present Illness: 40 y/o hispanic woman with PMH significant for depression, Sjorgen's syndrome comes to the clinic for a follow up visit.  She reports that the new medications that were started 2 weeks ago are helping her. She says that she is feeling great. Denies any depressed mood, loss of appetite, sleeping problems, SI, HI.  She mentioned that she will be going to the [place] soon, to get her immigration status legalized in [place]. But she was not sure about the timeframe when would be she going. She would be doing that in order to get a SSN that would help her with the insurance.  She endorses some fatigue. But denies fever/N/V/D/abdominal pain/ dysuria.  Depression History:      The patient denies a depressed mood most of the day and a diminished interest in her usual daily activities.        Comments:  better with the "pills".   Preventive Screening-Counseling & Management  Alcohol-Tobacco     Alcohol drinks/day: 0     Smoking Status: quit     Smoking Cessation Counseling: yes     Packs/Day: 1-2     Year Quit: 1 YR AG0  Current Medications (verified): 1)  Systane Ultra 0.4-0.3 % Soln (Polyethyl Glycol-Propyl  Glycol) .Marland Kitchen.. 1-2 Drops in Each Eye As Needed 2)  Biotene Moisturizing Mouth  Soln (Artificial Saliva) .... Spray in Mouth As Needed 3)  Citalopram Hydrobromide 20 Mg Tabs (Citalopram Hydrobromide) .... Take 1 Tablet By Mouth Once A Day 4)  Clonazepam 0.5 Mg Tabs (Clonazepam) .... Take 1 Tablet By Mouth Two Times A Day  Allergies (verified): No Known Drug Allergies  Review of Systems      See HPI  The patient denies anorexia, fever, weight loss, weight gain, chest pain, syncope, dyspnea on exertion, headaches, and abdominal pain.    Physical Exam  General:  alert, well-developed, and well-nourished.   Head:  normocephalic and atraumatic.   Eyes:  vision grossly intact, pupils equal, pupils round, and pupils reactive to light.   Mouth:  pharynx pink and moist.   Neck:  supple and full ROM.   Lungs:  normal respiratory effort, no intercostal retractions, no accessory muscle use, normal breath sounds, no dullness, no fremitus, no crackles, and no wheezes.   Heart:  normal rate, regular rhythm, no murmur, no gallop, and no rub.   Abdomen:  soft, non-tender, normal bowel sounds, no distention, no masses, no guarding, no rigidity, and no rebound tenderness.   Msk:  normal ROM, no joint tenderness, no joint swelling, and  no joint warmth.   Pulses:  2+ pulses b/l. Extremities:  no cyanosis , no clubbing. Neurologic:  alert & oriented X3, cranial nerves II-XII intact, strength normal in all extremities, sensation intact to light touch, and DTRs symmetrical and normal.   Psych:  good eye contact, not anxious appearing, not depressed appearing, not suicidal, and not homicidal.     Impression & Recommendations:  Problem # 1:  DEPRESSION, HX OF (ICD-V11.8) Assessment Improved  She reports that she is feeling better. Denies any depressed mood, SI, HI. Will continue her on the same regimen. Our SW , Ms. Dorothe Pea also talked to her with me during this visit with me, where we d/w the patient  about calling the helplines for councelling though she doesnot look receptive to this idea. She was explained how difficult would it  be to arrange a f/u with psychiatrist without insurance and the patient seems to have full understanding of this.Given the h/o  previous suicide attempts by taking pills, there was a concern for starting her on BZD. But as mentioned , she denies any SI, HI at this time and also given that BZD are helping her, will continue for now. But will give her prescription with one refill to avoid having lot of pills at one time.  Orders: Social Work Referral (Social )  Problem # 2:  FATIGUE (ICD-780.79) Assessment: Unchanged Multifactorial from her rheumatologic diseases vs depression and anxiety. She was also concerned about the test results for her rheum work up, but they are not available with [place]. She was adviced to follow up with her rheumatologist for that.  Complete Medication List: 1)  Systane Ultra 0.4-0.3 % Soln (Polyethyl glycol-propyl glycol) .Marland Kitchen.. 1-2 drops in each eye as needed 2)  Biotene Moisturizing Mouth Soln (Artificial saliva) .... Spray in mouth as needed 3)  Citalopram Hydrobromide 20 Mg Tabs (Citalopram hydrobromide) .... Take 1 tablet by mouth once a day 4)  Clonazepam 0.5 Mg Tabs (Clonazepam) .... Take 1 tablet by mouth two times a day  Patient Instructions: 1)  Please take your medicines as prescribed. 2)  Please schedule a follow-up appointment in 1 month. 3)  Please call 911 or come to the clinic if you feel depressed and have SI or HI. Prescriptions: CLONAZEPAM 0.5 MG TABS (CLONAZEPAM) Take 1 tablet by mouth two times a day  #30 x 1   Entered and Authorized by:   Elyse Jarvis   Signed by:   Elyse Jarvis on 04/19/2010   Method used:   Print then Give to Patient   RxID:   [id number] CLONAZEPAM 0.5 MG TABS (CLONAZEPAM) Take 1 tablet by mouth two times a day  #14 x 1   Entered and Authorized by:   Elyse Jarvis   Signed by:   Elyse Jarvis on 04/19/2010   Method used:   Print then Give to Patient   RxID:   [id number] CLONAZEPAM 0.5 MG TABS (CLONAZEPAM) Take 1 tablet by mouth two times a day  #14 x 1   Entered and Authorized by:   Elyse Jarvis   Signed by:   Elyse Jarvis on 04/19/2010   Method used:   Print then Give to Patient   RxID:   [id number] CITALOPRAM HYDROBROMIDE 20 MG TABS (CITALOPRAM HYDROBROMIDE) Take 1 tablet by mouth once a day  #30 x 0   Entered and Authorized by:   Elyse Jarvis   Signed by:   Elyse Jarvis on 04/19/2010   Method used:  Print then Give to Patient   RxID:   [id number]    Orders Added: 1)  Est. Patient Level IV [84166] 2)  Social Work Referral Blush.Krone ]   Immunization History:  Influenza Immunization History:    Influenza:  historical (04/03/2010)   Immunization History:  Influenza Immunization History:    Influenza:  Historical (04/03/2010)   Prevention & Chronic Care Immunizations   Influenza vaccine: Historical  (04/03/2010)   Influenza vaccine deferral: Deferred  (12/22/2008)   Influenza vaccine due: 02/07/2009    Tetanus booster: Not documented   Td booster deferral: Deferred  (12/22/2008)    Pneumococcal vaccine: Not documented   Pneumococcal vaccine deferral: Deferred  (12/22/2008)  Other Screening   Pap smear: Normal  (08/25/2005)   Pap smear action/deferral: Not indicated S/P hysterectomy  (12/14/2008)    Mammogram: 00202.0^MM DIGITAL SCREENING  (10/22/2009)   Smoking status: quit  (04/19/2010)  Lipids   Total Cholesterol: 215  (04/03/2010)   Lipid panel action/deferral: Lipid Panel ordered   LDL: 131  (04/03/2010)   LDL Direct: Not documented   HDL: 55  (04/03/2010)   Triglycerides: 147  (04/03/2010)

## 2010-07-09 NOTE — Miscellaneous (Signed)
Clinical Lists Changes  Medications: Added new medication of CITALOPRAM HYDROBROMIDE 20 MG TABS (CITALOPRAM HYDROBROMIDE) Take 1 tablet by mouth once a day - Signed Added new medication of CLONAZEPAM 0.5 MG TABS (CLONAZEPAM) Take 1 tablet by mouth two times a day - Signed Rx of CITALOPRAM HYDROBROMIDE 20 MG TABS (CITALOPRAM HYDROBROMIDE) Take 1 tablet by mouth once a day;  #30 x 0;  Signed;  Entered by: Mariea Stable MD;  Authorized by: Mariea Stable MD;  Method used: Telephoned to [facility] [phone] [place], [place], [place]  [address], Ph: [phone], Fax: [phone] Rx of CLONAZEPAM 0.5 MG TABS (CLONAZEPAM) Take 1 tablet by mouth two times a day;  #14 x 0;  Signed;  Entered by: Mariea Stable MD;  Authorized by: Mariea Stable MD;  Method used: Telephoned to [facility] [phone] [place], [place], [place]  [address], Ph: [phone], Fax: [phone]    Prescriptions: CLONAZEPAM 0.5 MG TABS (CLONAZEPAM) Take 1 tablet by mouth two times a day  #14 x 0   Entered and Authorized by:   Mariea Stable MD   Signed by:   Mariea Stable MD on 04/04/2010   Method used:   Telephoned to ...       [facility] [address] (retail)       [phone]       [place], [place]  [address]       Ph: [phone]       Fax: [phone]   RxID:   [id number] CITALOPRAM HYDROBROMIDE 20 MG TABS (CITALOPRAM HYDROBROMIDE) Take 1 tablet by mouth once a day  #30 x 0   Entered and Authorized by:   Mariea Stable MD   Signed by:   Mariea Stable MD on 04/04/2010   Method used:   Telephoned to ...       [facility] [address] (retail)       [phone]       [place], [place]  [address]       Ph: [phone]       Fax: [phone]   RxID:   [id number]    Problem # 13:  DEPRESSION, HX OF (ICD-V11.8) Please refer to my note from yesterday  and Lupita Leash Tessitore's Will start citalopram 20mg  daily.  Pt is limited due to income/cost of medications.  SSRI options on $4 list include citalopram, fluoxetine and paroxetine.  She had a good response to fluoxetine but stopped due to weight gain.  Paroxetine is usually much worse at weight gain and will therefore avoid.    Will see pt back in 2 weeks to assess depression and for SI, and will follow q 2-4 weeks depending on pt response. Will titrate dose up after 2-4 weeks if needed.   Problem # 14:  PANIC ATTACK (ICD-300.01) As above....expect citalopram to help wth anxiety disorder. Pt does have panic attack component and would like to treat with benzo.  However, given pt report of prior SI/attempt with medication I would be very cautious in doing so.  Given pt appears to require something to help acutely, will provide klonopin 0.5mg  two times a day in 1 week supplies (this will be 7mg  total which should not be a large enough dose to compormise respiratory drive if taken all at once) with the idea of tapering off as SSRI takes effect.  Her updated  medication list for this problem includes:    Citalopram Hydrobromide 20 Mg Tabs (Citalopram hydrobromide) .Marland Kitchen... Take 1 tablet by mouth once a day    Clonazepam 0.5 Mg Tabs (Clonazepam) .Marland Kitchen... Take 1 tablet by mouth two times a day   Complete Medication List: 1)  Systane Ultra 0.4-0.3 % Soln (Polyethyl glycol-propyl glycol) .Marland Kitchen.. 1-2 drops in each eye as needed 2)  Biotene Moisturizing Mouth Soln (Artificial saliva) .... Spray in mouth as needed 3)  Citalopram Hydrobromide 20 Mg Tabs (Citalopram hydrobromide) .... Take 1 tablet by mouth once a day 4)  Clonazepam 0.5 Mg Tabs (Clonazepam) .... Take 1 tablet by mouth two times a day  Appended Document:  Prescription for Clonazepam 0.5 mg #14 with no refills and Citalopram Hydrobromide 20 mg tablets #30 with no refills called to the [facility] on [address] per order of Dr. Onalee Hua.  Angelina Ok, RN 10:30  AM

## 2010-07-09 NOTE — Assessment & Plan Note (Signed)
Summary: SORE THROAT/SB.   Vital Signs:  Patient profile:   40 year old female Height:      63 inches (160.02 cm) Weight:      152.7 pounds (69.41 kg) BMI:     27.15 Temp:     97.0 degrees F oral Pulse rate:   63 / minute BP sitting:   120 / 81  (right arm)  Vitals Entered By: Chinita Pester RN (March 07, 2010 9:54 AM) CC: c/o being tired, no energy, dry mouth, facial pain, and left eye swollen x 2 months after her throat surgery. Is Patient Diabetic? No Pain Assessment Patient in pain? no      Nutritional Status BMI of 25 - 29 = overweight  Have you ever been in a relationship where you felt threatened, hurt or afraid?No   Does patient need assistance? Functional Status Self care Ambulation Normal   Primary Care Chinedu Agustin:  Mariea Stable MD  CC:  c/o being tired, no energy, dry mouth, facial pain, and and left eye swollen x 2 months after her throat surgery.Marland Kitchen  History of Present Illness: Patient is a 40 year old woman with pmh as indicated in EMR presents [facility] the [facility] for:  1) Facial pain, left eye orbital swelling, fatigue, and dry mouth that began after removal of mass on neck Jan 2011. Patient had a sympathetic benign schwannoma removed.   She complains of extreme dry mouth and at night she wakes up every 30 mins to an hour to drink water. She is currently using OTC Biotene moisturizing mouth spray about 8 times a day. In the morning, she feels dizzy, an wobbly.  She also complains of dry eyes, and was seen by Ophthamology last week and was given Systane Ultra eye drops. Used these drops 4 times a day, but still complains of dry eye.  She mentions having joint pains in her wrists, fingers and knees and complains of stiffness.   Of note, patient speaks very little English, thus history was somewhat difficult to obtain.     Depression History:      The patient denies a depressed mood most of the day and a diminished interest in her usual daily activities.          Preventive Screening-Counseling & Management  Alcohol-Tobacco     Alcohol drinks/day: 0     Smoking Status: quit     Smoking Cessation Counseling: yes     Packs/Day: 1-2     Year Quit: 1 YR AG0  Caffeine-Diet-Exercise     Does Patient Exercise: no  Current Medications (verified): 1)  Systane Ultra 0.4-0.3 % Soln (Polyethyl Glycol-Propyl Glycol) .Marland Kitchen.. 1-2 Drops in Each Eye As Needed 2)  Biotene Moisturizing Mouth  Soln (Artificial Saliva) .... Spray in Mouth As Needed  Allergies (verified): No Known Drug Allergies  Past History:  Past Medical History: Last updated: 11/29/2007 H/O tension headache  History of tobacco use Recurrent Gardnerella Vaginalis infection h/o metrorhagia treated with OCP Fibroids Depression Iron deficiency anemia  Social History: Last updated: 03/07/2010 Working part time at [employer] Former Smoker Regular exercise-yes  Risk Factors: Alcohol Use: 0 (03/07/2010) Exercise: no (03/07/2010)  Risk Factors: Smoking Status: quit (03/07/2010) Packs/Day: 1-2 (03/07/2010)  Social History: Working part time at [employer] Former Smoker Regular exercise-yes  Review of Systems      See HPI  Physical Exam  General:  alert and well-developed.  VS reviewed and wnlalert and well-developed.   Head:  normocephalic and atraumatic.  normocephalic and atraumatic.   Eyes:  Left eye orbit swollen, tender to palpation, no warmth pupils equal, round, reactive to light bilaterally vision grossly intact  corneas and lenses clear  Ears:  R ear normal, L ear normal, no external deformities, and ear piercing(s) noted.   Nose:  no external deformity, no external erythema, no nasal discharge, no mucosal pallor, and no mucosal edema.   Mouth:  mild pharyngeal erythema, no exudates pharynx pink and moist, no posterior lymphoid hypertrophy, and no tongue abnormalities.   Neck:  supple and no masses. scar on left side of neck where mass was removed tender   Lungs:  normal respiratory effort, normal breath sounds, and no dullness.   Heart:  normal rate, regular rhythm, no murmur, no gallop, and no rub.  normal rate, regular rhythm, no murmur, no gallop, and no rub.   Abdomen:  soft, non-tender, and normal bowel sounds.  soft, non-tender, and normal bowel sounds.   Msk:  normal ROM.  normal ROM.   Pulses:  pulses 2+ Bilaterally Extremities:  no lower extremity noted  Neurologic:  alert & oriented X3.  alert & oriented X3.     Impression & Recommendations:  Problem # 1:  SORE THROAT (ICD-462) Assessment Deteriorated Symptoms of dry eye, mouth, and sore throat that began post-op Jan 2011 and progressively worsening. Differential dx including unresolved or recurrent manifestation of schwannoma vs autoimmune vs infectious. Symptoms of dry eye and dry mouth along with nonspecific complaints of joint pain and fatigue make autoimmune pathology such as Sjogren's syndrome possible. Plan to check ab to Ro/SSA and La/SSb as well as ESR, and ANA. RA is often associated with Sjogren's, however other than mild joint pain, pt does not have any inflammation present. Will await labs before checking RF. A rapid strep was also checked today and was negative. Patient has had HIV checked in the past (2 times) and each time have been negative. Will also have patient follow up with [facility] for further evaluation. There are no visible masses and none were palpated, will not pursue imaging at this point.   Orders: T- Sed rate non-auto (53664) T-Sjogrens Syndrome Ab ([id number]) T-Antinuclear Antib (ANA) [id number]) T-CBC w/Diff ([id number]) T-Culture, Rapid Strep ([id number])  Her updated medication list for this problem includes:    Biotene Moisturizing Mouth Soln (Artificial saliva) ..... Spray in mouth as needed  Problem # 2:  DRY EYE SYNDROME (ICD-375.15) Assessment: Deteriorated Will obtain records from Ophthamologist as patient was seen last week. Advised  patient to continue with eye drops for symptomatic relief. Please refer to problem one for more detail.   Orders: T-Sjogrens Syndrome Ab ([id number]) T-Antinuclear Antib (ANA) ([id number])  Complete Medication List: 1)  Systane Ultra 0.4-0.3 % Soln (Polyethyl glycol-propyl glycol) .Marland Kitchen.. 1-2 drops in each eye as needed 2)  Biotene Moisturizing Mouth Soln (Artificial saliva) .... Spray in mouth as needed  Patient Instructions: 1)  Please follow up with ENT as scheduled.  2)  Please follow up with Dr. Onalee Hua in 2 weeks to 1 month.  3)  Please continue with eye drops and mouth spray for dry eye and mouth.   Appt.scheduled with Dr[facility]Dorton at [facility] Friday 03/08/10 @ 1115AM;appt card given to pt. Chinita Pester RN  March 07, 2010 11:06 AM   Prevention & Chronic Care Immunizations   Influenza vaccine: Not documented   Influenza vaccine deferral: Deferred  (12/22/2008)   Influenza vaccine due: 02/07/2009    Tetanus booster: Not documented  Td booster deferral: Deferred  (12/22/2008)    Pneumococcal vaccine: Not documented   Pneumococcal vaccine deferral: Deferred  (12/22/2008)  Other Screening   Pap smear: Normal  (08/25/2005)   Pap smear action/deferral: Not indicated S/P hysterectomy  (12/14/2008)    Mammogram: 00202.0^MM DIGITAL SCREENING  (10/22/2009)   Smoking status: quit  (03/07/2010)  Lipids   Total Cholesterol: Not documented   LDL: Not documented   LDL Direct: Not documented   HDL: Not documented   Triglycerides: Not documented  Process Orders Check Orders Results:     [facility]: ABN not required for this insurance Tests Sent for requisitioning (March 07, 2010 12:33 PM):     03/07/2010: [facility] -- T- Sed rate non-auto [[id number]] (signed)     03/07/2010: [facility] -- T-Sjogrens Syndrome Ab [[id number]] (signed)     03/07/2010: [facility] -- T-Antinuclear Antib (ANA)  [[id number]] (signed)     03/07/2010: [facility] -- T-CBC w/Diff [[id number]] (signed)     03/07/2010: [facility] -- T-Culture, Rapid Strep [[id number]] (signed)

## 2010-07-09 NOTE — Miscellaneous (Signed)
Summary: HEALTH INFORMATION  HEALTH INFORMATION   Imported By: Margie Billet 04/22/2010 15:53:37  _____________________________________________________________________  External Attachment:    Type:   Image     Comment:   External Document

## 2010-07-09 NOTE — Letter (Signed)
Summary: [facility]: Clinic Letter  [facility]: Clinic Letter   Imported By: Florinda Marker 06/25/2009 14:54:55  _____________________________________________________________________  External Attachment:    Type:   Image     Comment:   External Document

## 2010-07-09 NOTE — Progress Notes (Signed)
Summary: refill/ hla  Phone Note Refill Request Message from:  Patient on April 11, 2010 4:11 PM  Refills Requested: Medication #1:  CLONAZEPAM 0.5 MG TABS Take 1 tablet by mouth two times a day.   Dosage confirmed as above?Dosage Confirmed   Last Refilled: 10/27 Initial call taken by: Marin Roberts RN,  April 11, 2010 4:11 PM  Follow-up for Phone Call        Refill approved-nurse to complete Follow-up by: Mariea Stable MD,  April 11, 2010 4:32 PM  Additional Follow-up for Phone Call Additional follow up Details #1::        Rx called to pharmacy Additional Follow-up by: Marin Roberts RN,  April 12, 2010 3:21 PM    Prescriptions: CLONAZEPAM 0.5 MG TABS (CLONAZEPAM) Take 1 tablet by mouth two times a day  #14 x 0   Entered and Authorized by:   Mariea Stable MD   Signed by:   Mariea Stable MD on 04/11/2010   Method used:   Telephoned to ...       [facility] [address] (retail)       [phone]       [place], [place]  [address]       Ph: [phone]       Fax: [phone]   RxID:   [id number]

## 2010-07-11 NOTE — Assessment & Plan Note (Signed)
Summary: FU VISIT/DS   Vital Signs:  Patient profile:   40 year old female Height:      63 inches Weight:      150.8 pounds BMI:     26.81 Temp:     97.6 degrees F oral Pulse rate:   82 / minute BP sitting:   107 / 73  (right arm)  Vitals Entered By: Filomena Jungling NT II (May 20, 2010 2:36 PM) CC: followupvisit Is Patient Diabetic? No Pain Assessment Patient in pain? no      Nutritional Status BMI of 25 - 29 = overweight  Does patient need assistance? Functional Status Self care Ambulation Normal   Primary Care Rexford Prevo:  Mariea Stable MD  CC:  followupvisit.  History of Present Illness: 40 y/o woman with PMH significant for depression comes to the clinic for a f/u visit.  She reports feeling sad as her father in law died about 2 weeks ago.  She says that she will be going to [place] in 2 weeks and needs some documentation for immigration purposes for the the meds she is taking.  Preventive Screening-Counseling & Management  Alcohol-Tobacco     Alcohol drinks/day: 0     Smoking Status: quit     Smoking Cessation Counseling: yes     Packs/Day: 1-2     Year Quit: 1 YR AG0  Caffeine-Diet-Exercise     Does Patient Exercise: no  Current Medications (verified): 1)  Systane Ultra 0.4-0.3 % Soln (Polyethyl Glycol-Propyl Glycol) .Marland Kitchen.. 1-2 Drops in Each Eye As Needed 2)  Biotene Moisturizing Mouth  Soln (Artificial Saliva) .... Spray in Mouth As Needed 3)  Citalopram Hydrobromide 20 Mg Tabs (Citalopram Hydrobromide) .... Take 1 Tablet By Mouth Once A Day 4)  Clonazepam 0.5 Mg Tabs (Clonazepam) .... 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  Followed at [facility] (           Systane eye drops improved symptoms      LUL ptosis 2/2 Horner's:  f/u with Dr. Corinne Ports  Past Surgical History: Last updated: 04/03/2010 excision of benign schwanomma left neck 06/20/09 by Dr. Reine [facility])  Social History: Last updated: 03/07/2010 Working part time at [employer] Former Smoker Regular exercise-yes  Risk Factors: Alcohol Use: 0 (05/20/2010) Exercise: no (05/20/2010)  Risk Factors: Smoking Status: quit (05/20/2010) Packs/Day: 1-2 (05/20/2010)  Family History: No family h/o DM, HTN, depression.  Review of Systems      See HPI  The patient denies anorexia, fever, vision loss, decreased hearing, hoarseness, chest pain, syncope, dyspnea on exertion, peripheral edema, prolonged cough, abdominal pain, and melena.    Physical Exam  General:  alert, well-developed, and well-nourished.   Head:  normocephalic and atraumatic.   Eyes:  vision grossly intact, pupils equal, pupils round, and pupils reactive to light.   Mouth:  good dentition.   Neck:  supple and full ROM.   Lungs:  normal respiratory effort, no intercostal retractions, no accessory muscle use, normal breath sounds, no dullness, no fremitus, no crackles, and no wheezes.   Heart:  normal rate, regular rhythm, no murmur, no gallop, no rub, and no JVD.  Abdomen:  soft, non-tender, normal bowel sounds, no distention, no masses, no guarding, no rigidity, and no rebound tenderness.   Msk:  normal ROM, no joint tenderness, no joint swelling, no joint warmth, no redness over joints, no joint deformities, and no joint instability.   Pulses:  2+pulses b/l. Extremities:  no cynaosis, clubbing or edema. Neurologic:  alert & oriented X3, cranial nerves II-XII intact, strength normal in all extremities, sensation intact to light touch, gait normal, and DTRs symmetrical and normal.     Impression & Recommendations:  Problem # 1:  DEPRESSION, HX OF  (ICD-V11.8) Assessment Comment Only She denies any depressed mood throughout the day.  She reports that her antidepressant meds are helping her.Denies SI, HI. But given her h/o suicidal attempt will the pills, didn't give her refills for celexa and gave one refill with 2 weeks supply for BZD so that she does not have a lot of pills at one time.  Problem # 2:  HEALTH MAINTENANCE EXAM (ICD-V70.0) She was given a shot for Td today as she fell and hurt her knee.  Complete Medication List: 1)  Systane Ultra 0.4-0.3 % Soln (Polyethyl glycol-propyl glycol) .Marland Kitchen.. 1-2 drops in each eye as needed 2)  Biotene Moisturizing Mouth Soln (Artificial saliva) .... Spray in mouth as needed 3)  Citalopram Hydrobromide 20 Mg Tabs (Citalopram hydrobromide) .... Take 1 tablet by mouth once a day 4)  Clonazepam 0.5 Mg Tabs (Clonazepam) .... Take 1 tablet by mouth two times a day  Other Orders: TD Toxoids IM 7 YR + (16109) Admin 1st Vaccine (60454)  Patient Instructions: 1)  Please schedule a follow-up appointment in 1 month. 2)  Please take your medicines as prescribed. Prescriptions: CITALOPRAM HYDROBROMIDE 20 MG TABS (CITALOPRAM HYDROBROMIDE) Take 1 tablet by mouth once a day  #30 x 0   Entered and Authorized by:   Elyse Jarvis   Signed by:   Elyse Jarvis on 05/20/2010   Method used:   Print then Give to Patient   RxID:   [id number] CLONAZEPAM 0.5 MG TABS (CLONAZEPAM) Take 1 tablet by mouth two times a day  #30 x 1   Entered and Authorized by:   Elyse Jarvis   Signed by:   Elyse Jarvis on 05/20/2010   Method used:   Print then Give to Patient   RxID:   [id number] CLONAZEPAM 0.5 MG TABS (CLONAZEPAM) Take 1 tablet by mouth two times a day  #30 x 1   Entered and Authorized by:   Elyse Jarvis   Signed by:   Elyse Jarvis on 05/20/2010   Method used:   Print then Give to Patient   RxID:   [id number] CITALOPRAM HYDROBROMIDE 20 MG TABS (CITALOPRAM HYDROBROMIDE) Take 1 tablet by mouth  once a day  #30 x 0   Entered and Authorized by:   Elyse Jarvis   Signed by:   Elyse Jarvis on 05/20/2010   Method used:   Print then Give to Patient   RxID:   [id number]    Orders Added: 1)  Est. Patient Level IV [64332] 2)  TD Toxoids IM 7 YR + [95188] 3)  Admin 1st Vaccine [41660]   Immunizations Administered:  Tetanus Vaccine:    Vaccine Type: Td    Site: right deltoid    Mfr: Sanofi Pasteur    Dose: 0.5 ml    Route: IM    Given by: Chinita Pester RN    Exp. Date: 09/20/2011    Lot #: [id number]  VIS given: 04/26/08 version given May 20, 2010.   Immunizations Administered:  Tetanus Vaccine:    Vaccine Type: Td    Site: right deltoid    Mfr: Sanofi Pasteur    Dose: 0.5 ml    Route: IM    Given by: Chinita Pester RN    Exp. Date: 09/20/2011    Lot #: [id number]    VIS given: 04/26/08 version given May 20, 2010.   Prevention & Chronic Care Immunizations   Influenza vaccine: Historical  (04/03/2010)   Influenza vaccine deferral: Deferred  (12/22/2008)   Influenza vaccine due: 02/07/2009    Tetanus booster: 05/20/2010: Td   Td booster deferral: Deferred  (12/22/2008)    Pneumococcal vaccine: Not documented   Pneumococcal vaccine deferral: Deferred  (12/22/2008)  Other Screening   Pap smear: Normal  (08/25/2005)   Pap smear action/deferral: Not indicated S/P hysterectomy  (12/14/2008)    Mammogram: 00202.0^MM DIGITAL SCREENING  (10/22/2009)   Smoking status: quit  (05/20/2010)  Lipids   Total Cholesterol: 215  (04/03/2010)   Lipid panel action/deferral: Lipid Panel ordered   LDL: 131  (04/03/2010)   LDL Direct: Not documented   HDL: 55  (04/03/2010)   Triglycerides: 147  (04/03/2010)

## 2010-08-17 ENCOUNTER — Encounter: Payer: Self-pay | Admitting: Internal Medicine

## 2010-09-11 LAB — CBC
MCHC: 34 g/dL (ref 30.0–36.0)
Platelets: 244 10*3/uL (ref 150–400)
RBC: 4.09 MIL/uL (ref 3.87–5.11)
RDW: 17.3 % — ABNORMAL HIGH (ref 11.5–15.5)

## 2010-09-11 LAB — URINALYSIS, ROUTINE W REFLEX MICROSCOPIC
Ketones, ur: NEGATIVE mg/dL
Leukocytes, UA: NEGATIVE
Nitrite: NEGATIVE
pH: 5 (ref 5.0–8.0)

## 2010-09-11 LAB — DIFFERENTIAL
Eosinophils Absolute: 0.1 10*3/uL (ref 0.0–0.7)
Lymphocytes Relative: 25 % (ref 12–46)
Lymphs Abs: 1.4 10*3/uL (ref 0.7–4.0)
Monocytes Relative: 9 % (ref 3–12)
Neutro Abs: 3.6 10*3/uL (ref 1.7–7.7)
Neutrophils Relative %: 64 % (ref 43–77)

## 2010-09-11 LAB — COMPREHENSIVE METABOLIC PANEL
ALT: 26 U/L (ref 0–35)
BUN: 7 mg/dL (ref 6–23)
CO2: 23 mEq/L (ref 19–32)
Calcium: 8.8 mg/dL (ref 8.4–10.5)
Creatinine, Ser: 0.62 mg/dL (ref 0.4–1.2)
GFR calc non Af Amer: >60 mL/min (ref >60)
Glucose, Bld: 98 mg/dL (ref 70–99)
Total Protein: 7.7 g/dL (ref 6.0–8.3)

## 2010-09-11 LAB — URINE MICROSCOPIC-ADD ON

## 2010-09-11 LAB — HEMOCCULT GUIAC POC 1CARD (OFFICE): Fecal Occult Bld: NEGATIVE

## 2010-09-23 LAB — CBC
HCT: 26.1 % — ABNORMAL LOW (ref 36.0–46.0)
HCT: 32.7 % — ABNORMAL LOW (ref 36.0–46.0)
Hemoglobin: 10.9 g/dL — ABNORMAL LOW (ref 12.0–15.0)
MCV: 81.5 fL (ref 78.0–100.0)
RBC: 3.21 MIL/uL — ABNORMAL LOW (ref 3.87–5.11)
RBC: 4.06 MIL/uL (ref 3.87–5.11)
WBC: 18.7 10*3/uL — ABNORMAL HIGH (ref 4.0–10.5)

## 2010-09-23 LAB — TYPE AND SCREEN: Antibody Screen: NEGATIVE

## 2010-10-22 NOTE — Op Note (Signed)
Lori Murray, Lori Murray        ACCOUNT NO.:  [id number]   MEDICAL RECORD NO.:  [id number]          PATIENT TYPE:  INP   LOCATION:  9311                          [facility]   PHYSICIAN:  Allie Bossier, MD        DATE OF BIRTH:  [birth year]   DATE OF PROCEDURE:  DATE OF DISCHARGE:  06/21/2008                               OPERATIVE REPORT   PREOPERATIVE DIAGNOSES:  Chronic pelvic pain, right lower quadrant pain.   POSTOPERATIVE DIAGNOSES:  Chronic pelvic pain, right lower quadrant  pain.   PROCEDURE:  Laparoscopy, total vaginal hysterectomy, right salpingo-  oophorectomy.   SURGEON:  Myra C. Marice Potter, MD   ASSISTANT:  Shelbie Proctor. Shawnie Pons, MD and Tomma Rakers, PA student.   ANESTHESIA:  General.   COMPLICATIONS:  None.   FINDINGS:  No evidence of endometriosis in her pelvis.   SPECIMENS:  Uterus, right tube, and ovary.   ESTIMATED BLOOD LOSS:  100 mL.   COMPLICATIONS:  None.   DETAILS OF PROCEDURE AND FINDINGS:  The risks, benefits, and  alternatives of the surgery were explained, understood, and accepted.  Consents were signed.  She was taken to the operating room, and placed  in a dorsal lithotomy position.  General anesthesia was applied without  complication.  After she report herself to be comfortable, her abdomen  and vagina were prepped and draped in usual sterile fashion.  A Foley  catheter was placed which drained clear urine throughout the case.  A  Hulka manipulator was placed.  Gloves were changed.  Attention was  turned to the abdomen.  Her umbilicus was inspected.  There was a  previous scar from tubal ligation.  I made her laparoscopy incision at  the site of her previous incision and a Veress needle was placed  intraperitoneally.  Low-flow CO2 was used to insufflate the abdomen to  approximately 2.5 L.  The patient's abdominal pressure was always less  than 10.  A 10-mm trocar was placed.  She was put in Trendelenburg  position and laparoscopy confirmed correct  placement.  A 5-mm port was  placed in each lower quadrant under direct laparoscopic visualization.  The pelvis was inspected in alpha manner.  Beginning with the right  adnexa, there were no abnormalities noted.  The right ovarian fossa  right at the cul-de-sac was normal.  The left ovarian fossa, left  adnexa, and anterior cul-de-sac were normal as well.  She and I  discussed that her right adnexal would be removed with the gyrus  instrument, I was able to take down the infundibulopelvic ligament.  Please note that, I identified the course of the peristalsing ureter  prior to using the gyrus.  The ureter was noted to be well out of the  operative site.  The contralateral ureter was identified and its  position was noted.  The CO2 was allowed to escape from the abdomen and  laparoscope was removed, and the vaginal portion of the procedure was  begun.  The Hulka manipulator was removed and a weighted speculum was  placed posteriorly.  Cervix was grasped with a single-tooth tenaculum  and  30 mL of 0.5% Marcaine with lidocaine was injected in a  circumferential fashion at the cervicovaginal junction.  This was done  for hydrodissection purposes.  An incision was made with a #10 blade at  this site.  The posterior peritoneum was entered, identified, tagged,  and held.  2-0 Vicryl sutures used throughout this case unless otherwise  specified.  The anterior peritoneum was identified, entered, tagged, and  held as well.  The uterosacral ligaments were identified, clamped, cut,  and doubly ligated.  These were tagged and held.  The remainder of the  cervix was separated from its pelvic attachments using clamp, cut, and  ligate technique.  Please note that, Heaney suture was used throughout  unless otherwise specified.  The utero-ovarian ligaments were clamped,  cut, and ligated and the uterus was removed and noted to be intact.  All  pedicles were hemostatic.  I then grasped the right adnexa with  a  Babcock and gently pulled it towards me.  There was one connection to  the broad ligament that needed to be clamped, cut, and ligated to remove  the right adnexa.  All pedicles were hemostatic.  The peritoneum was  closed in a pursestring fashion with 2-0 Vicryl suture.  The uterosacral  ligaments were incorporated to the vaginal cuff with a mattress suture.  This was done for future vaginal support.  The vaginal cuff was closed  in a running locking fashion with 0 Vicryl suture.  Excellent hemostasis  was noted.  Her Foley catheter drained clear urine throughout.  Gloves  were changed and then we repeated the laparoscopy.  All pedicles  anteriorly were noted to be hemostatic.  The 5-mm ports were removed  under direct laparoscopic visualization.  No bleeding was noted.  The  CO2 was allowed to escape from the abdomen and then the port was  removed.  Marcaine was injected into the subcutaneous tissue at these  sites (total of 10 mL), subcuticular closure with 4-0 Vicryl was done at  all sites.  She tolerated the procedure well.  She was extubated and  taken to recovery room in stable condition.      Allie Bossier, MD  Electronically Signed     MCD/MEDQ  D:  06/21/2008  T:  06/22/2008  Job:  [id number]

## 2010-10-22 NOTE — Discharge Summary (Signed)
Lori Murray, Lori Murray        ACCOUNT NO.:  [id number]   MEDICAL RECORD NO.:  [id number]          PATIENT TYPE:  INP   LOCATION:  9311                          [facility]   PHYSICIAN:  Allie Bossier, MD        DATE OF BIRTH:  [birth year]   DATE OF ADMISSION:  06/20/2008  DATE OF DISCHARGE:  06/21/2008                               DISCHARGE SUMMARY   ADMISSION DIAGNOSES:  1. Chronic pelvic pain.  2. Right lower quadrant pain.   DISCHARGE DIAGNOSES:  1. Chronic pelvic pain.  2. Right lower quadrant pain.   PROCEDURES:  1. Laparoscopy.  2. Total vaginal hysterectomy.  3. Right salpingo-oophorectomy.   COMPLICATIONS:  None.   CONDITION:  Stable.   DISPOSITION:  Home.   DIET:  As tolerated.   ACTIVITY:  As tolerated.   FOLLOWUP:  In 6 weeks in the clinic.  She is to call for an appointment.   MEDICATIONS:  1. Percocet 325/5 mg one p.o. q.4 h p.r.n. pain, #40, no refills.  2. Ibuprofen 800 mg 1 p.o. q.8 h. p.r.n. pain, #40.   HISTORY OF PRESENT ILLNESS AND HOSPITAL COURSE:  Lori Murray is a 40 year old  single Hispanic gravida 4, para 3, abortus 1, who has been seen in the  GYN Clinic for years.  She has been having several years' history of  chronic pelvic pain, always in the right lower quadrant.  There were  some symptoms that she had that would be consistent with endometriosis  and we discussed taking out the right ovary with or without the left  ovary along with the hysterectomy.  The plan was to do a laparoscopy and  if there was endometriosis, to remove both adnexa.  She had the  laparoscopy and there was no evidence endometriosis; therefore, the  procedure done was a TVH-RSO.  This was without complications.  Preoperatively, her hemoglobin was 10.9, postop 8.8.  On postop day #1,  she was  ambulating, voiding without dysuria, tolerating p.o. without nausea and  vomiting.  She had positive bowel sounds and flatus.  She has been  afebrile throughout her hospital  course and feels ready to go home.  She  will follow up as above.      Allie Bossier, MD  Electronically Signed     MCD/MEDQ  D:  06/21/2008  T:  06/22/2008  Job:  [id number]

## 2010-10-22 NOTE — Group Therapy Note (Signed)
NAME:  Lori Murray, Lori Murray NO.:  [id number]   MEDICAL RECORD NO.:  [id number]          PATIENT TYPE:  WOC   LOCATION:  [facility]                   FACILITY:  [facility]   PHYSICIAN:  Argentina Donovan, MD        DATE OF BIRTH:  11/01/1970   DATE OF SERVICE:  01/12/2008                                  CLINIC NOTE   The patient is a 40 year old gravida 4, para 3-0-1-3 who has come in  with a 64-month history of severe right lower quadrant pain radiating  into her lower back and down into her thigh at the front of her thigh.  She had previous ultrasounds that gave the impression of adenomyosis.  Periods have been somewhat heavy but nothing really major.  They have  varied a little bit over the past few years.  She was examined in June  of this year, and no significant findings were noted.  She is in for  annual examination anyway, and Pap smear was done today.  On  examination, the abdomen is soft, flat, slightly tender in the right  lower quadrant but without guarding or rebound.  She has a history of a  tubal ligation at the age of 10.  On pelvic examination, external  genitalia normal.  BUS within normal limits.  The vagina is clean and  well rugated.  The cervix is clean, parous.  The uterus is of normal  size, shape, consistency, but markedly dextroverted with no reason that  I can find, and I cannot see that it was ever recorded before.  The  ultrasound does not mention that.  On movement of the uterus, it is  quite tender.  The adnexa are normal.   IMPRESSION:  Pelvic pressure, unknown etiology.  Possible adenomyosis.  Possible dextroversion of the uterus for reasons that I cannot explain.  I have told the patient that I think that if she wants something done  and I do not think she has anything serious we could go ahead and do a  laparoscopy on her, and if we found anything that was significant to go  ahead and do a hysterectomy, or we could do a laparoscopy and then wake  her, discuss it with her, and then later on do the hysterectomy if that  was what she wanted.  I have a feeling that after looking in and finding  nothing can she live with this discomfort because it is so subjective.  She is not sure what she wants to do.  I have told her we will do either  one.  My preference would be the laparoscopic examination with possible  TAH.   IMPRESSION:  Chronic pelvic right lower quadrant pressure with radiation  to lower back and thigh.  Dextroverted uterus.  Possible adenomyosis.  Mild menorrhagia.           ______________________________  Argentina Donovan, MD     PR/MEDQ  D:  01/12/2008  T:  01/12/2008  Job:  [id number]

## 2010-10-22 NOTE — Group Therapy Note (Signed)
NAME:  Lori Murray, Lori Murray NO.:  [id number]   MEDICAL RECORD NO.:  [id number]          PATIENT TYPE:  WOC   LOCATION:  [facility]                   FACILITY:  [facility]   PHYSICIAN:  Johnella Moloney, MD        DATE OF BIRTH:  26-Aug-1970   DATE OF SERVICE:  11/19/2007                                  CLINIC NOTE   The patient is a 40 year old G4, P 3-0-1-3, who was been followed in  this clinic for a long history of menorrhagia.  At her last visit in  2008, the patient had an ultrasound which was remarkable for adenomyosis  and two small focal fibroids.  Different options of management of  menorrhagia were discussed with the patient, including hormonal  management, endometrial ablation, or hysterectomy.  She opted for oral  contraceptives at that point and has been on Ortho-Novum 1/35.  She  reports that her periods have been regular since her last visit.  She  has had no further problems with menorrhagia.  However, she does say  that her problems normalized even before she was taking the oral  contraceptives.  She has not taken any oral contraceptives for the past  2 months and says that her periods have been fine, and she was wondering  if there was anything else that she needed to do at this point.  Of  note, the patient also reports having pelvic fullness.  She feels like  there is something to clawing at her insides, and she is very concerned  that this could be related to the focal fibroids that were seen on the  prior ultrasound.  She is requesting another ultrasound to evaluate her  pelvic anatomy at this visit.  The patient denies any other GYN  complaints.   PHYSICAL EXAMINATION:  VITAL SIGNS:  Her temperature is 98, pulse 62,  blood pressure 103/67, weight 149 pounds.  GENERAL:  No apparent distress.  ABDOMEN:  Mild diffuse tenderness in all quadrants of the abdomen when  palpated.  No rebound or guarding.  PELVIC:  Normal external female genitalia.  On bimanual  exam, the  patient is noted to have a small mobile uterus, anteverted.  No abnormal  masses palpated.  Normal adnexa.  However, the patient was noted to have  mild tenderness on palpation everywhere.   IMPRESSION:  1. Pelvic pain.  2. Resolved menorrhagia.   The patient was given the option to continue the oral contraceptive  pills, but she says that she is doing well without it and does not want  to continue this modality.  Bleeding precautions were reviewed with the  patient, and she says that she will follow up if she continues to have  any further bleeding problems and to discuss other options for  management.  As for her pelvic pain, will obtain a pelvic ultrasound to  look at her fibroids and her adnexa to see if this could come up with  any etiology of her pain.  The patient verbalizes understanding of this  plan and is agreeing to this plan.  Of note, the patient was told to  keep a menstrual diary for the  next 3 months and to come back if she  has any problems.  She will have an appointment scheduled in the next 2-  3 months for followup and also for her annual examination.           ______________________________  Johnella Moloney, MD     UD/MEDQ  D:  11/19/2007  T:  11/19/2007  Job:  [id number]

## 2010-10-22 NOTE — H&P (Signed)
NAME:  Lori, Murray NO.:  [id number]   MEDICAL RECORD NO.:  [id number]          PATIENT TYPE:  INP   LOCATION:  9311                          [facility]   PHYSICIAN:  Allie Bossier, MD        DATE OF BIRTH:  01-16-1971   DATE OF ADMISSION:  06/20/2008  DATE OF DISCHARGE:                              HISTORY & PHYSICAL   Lori Murray is a 40 year old single Hispanic, gravida 4, para 3, abortus 1, who  has been followed in our GYN Clinic for several years.  She has been  having a long-standing chronic pelvic pain, almost always in the right  lower quadrant.  She said that it present all the time.  She denies  actually dyspareunia, but says that she is certainly has pain after  intercourse, since the pain radiates down her anterior thigh especially  when she is at work.  Further complaint is that of heavy periods.  She  states they last up to 2 weeks at that time.  The pain is not  particularly worse with her periods, but her periods have increased in  length.  She had multiple ultrasounds done.  There are several tiny  posterior uterine fibroids.  It  measuring up to 1 cm, however,  everything and there was also a possible finding of adenomyosis on one  of her ultrasounds.  Please note that she has been tried on oral  contraceptive pills.  That at one point, she said they worked, but  currently she says they do not.   PAST MEDICAL HISTORY:  Distant history of anemia (although her  hemoglobin was normal with last checked), pelvic pain, and menorrhagia.   REVIEW OF SYSTEMS:  She works on a [employer].  She has been  monogamous since June 2009.   PAST SURGICAL HISTORY:  Tubal ligation.   No known drug allergies.  No LATEX allergies.   MEDICATIONS:  Advil and Tylenol as necessary and iron tablets.   PAST SURGICAL HISTORY:  Tubal ligation.   PHYSICAL EXAMINATION:  VITAL SIGNS: Stable, afebrile.  Height 5 feet 2.  Weight 151.  HEENT: Normal.  HEART: Regular  rate and rhythm with murmur, rubs, or gallops.  BREASTS:  Normal bilaterally.  ABDOMEN: Slightly obese and benign.  No hepatosplenomegaly.  EXTERNAL GENITALIA: Normal.  On bimanual exam, her uterus is about 6-8  weeks' size consistent with small fibroids.  Her uterus is mobile,  anteverted.  Her pelvis seems adequate for a vaginal hysterectomy.   ASSESSMENT AND PLAN:  Chronic pelvic pain/right lower quadrant pain that  may be due to endometriosis.  We will start with a laparoscopy and a  plan on removing the right adnexa, if I look it and see endometriosis.  She understands that to cure endometriosis.  She will have to be the  both adnexa will need to be removed.  She is agreeable to this, however  I do not see endometriosis, so I will leave the left adnexa and then  proceed with a vaginal  hysterectomy, assisting laparoscopically as needed.  For surgery  including, but not limited to infection, bleeding,  damage to bowel,  bladder, ureters, as well as DVTs.  I have been discussed with her.  She  understands and agrees and will proceed with surgery as planned.      Allie Bossier, MD  Electronically Signed     MCD/MEDQ  D:  06/20/2008  T:  06/20/2008  Job:  [id number]

## 2010-10-22 NOTE — Group Therapy Note (Signed)
NAME:  Lori Murray, Lori Murray NO.:  [id number]   MEDICAL RECORD NO.:  [id number]          PATIENT TYPE:  WOC   LOCATION:  [facility]                   FACILITY:  [facility]   PHYSICIAN:  Ginger Carne, MD DATE OF BIRTH:  1970-10-29   DATE OF SERVICE:                                  CLINIC NOTE   [name] returns today for followup on testing.  The patient has  menorrhagia with menses lasting eight days with significant  dysmenorrhea.  Ultrasound demonstrated evidence of suggestion of  adenomyosis and two small focal fibroids and no other findings, and her  GC and Chlamydia cultures were negative.  We discussed the various  options of management.  She has had a tubal ligation performed in  [place].  Her options include trying Ortho-Novum 135, an endometrial  ablation, and/or a total vaginal hysterectomy.  She has opted for oral  contraceptives and we will see how she does on them.  If she continues  to have problems with her menses, she will return for other options.  I  also asked her to take ibuprofen 600 mg four times a day as needed.           ______________________________  Ginger Carne, MD     SHB/MEDQ  D:  12/09/2006  T:  12/09/2006  Job:  [id number]

## 2010-10-22 NOTE — Group Therapy Note (Signed)
NAME:  ELARA, COCKE NO.:  [id number]   MEDICAL RECORD NO.:  [id number]          PATIENT TYPE:  WOC   LOCATION:  [facility]                   [facility]ACILITY[facility]   PHYSICIAN:  Allie Bossier, MD        DATE OF BIRTH:  [birth year]   DATE OF SERVICE:                                  CLINIC NOTE   [name] is a 40 year old single Hispanic, gravida 4, para 3, abortus  1, who has been seen here in this clinic for several years.  At all  times, she has been complaining of pelvic pain limited to the right  lower quadrant.  She describes this pain as all the time.  She denies  actual dyspareunia, but says that she certainly has pain after  intercourse.  She says that the pain radiates down her anterior thighs,  especially when she is at work.  Her other complaint is that of heavy  periods.  She states they last up to 2 weeks at a time.  The pain is not  particularly worse with her periods, but her periods have increased in  length.  She has had multiple ultrasounds done.  There are several tiny  posterior uterine fibroids measuring up to 1 cm, on another ultrasound  there was a finding of possible adenomyosis.   PAST MEDICAL HISTORY:  Distant history anemia (although, hemoglobin is  normal when last checked), pelvic pain, and menorrhagia.   REVIEW OF SYSTEMS:  She works at [employer].  She has been  monogamous since June 2009.   PAST SURGICAL HISTORY:  Tubal ligation.   No known drug allergies.  No Latex allergies.   MEDICATIONS:  She takes Advil p.r.n. and Tylenol as necessary.  She  still takes iron tablets.   PHYSICAL EXAMINATION:  VITAL SIGNS:  Height 5 feet 2, weight 151, blood  pressure 126/79, and pulse 62.  HEENT:  Normal.  HEART:  Regular rate and rhythm.  LUNGS:  Clear to auscultation bilaterally.  ABDOMEN:  Benign.  EXTERNAL GENITALIA:  Normal.  Her uterus is about 6-8 weeks size  consistent with fibroids.  Her uterus is mobile and anteverted.   (Dr.  Okey Dupre reported and appreciated dextroversion of the uterus on pelvic  exam).   ASSESSMENT AND PLAN:  1. Pelvic pain/Right lower quadrant pain.  2. Menorrhagia with intermittent anemia.  I agreed with Dr. Mia Creek      as she plans to perform a laparoscopic-assisted vaginal      hysterectomy, in addition, I would recommend removal of the right      ovary, since she has her      pain always on the right-hand side.  Should we look in there and      see extensive endometriosis.  She is agreeable to having the left      ovary removed as well.      Allie Bossier, MD     MCD/MEDQ  D:  04/26/2008  T:  04/27/2008  Job:  [id number]

## 2010-10-25 NOTE — Group Therapy Note (Signed)
NAME:  ALGA, SOUTHALL NO.:  [id number]   MEDICAL RECORD NO.:  [id number]          PATIENT TYPE:  WOC   LOCATION:  [facility]                   [facility]ACILITY:  [facility]   PHYSICIAN:  Argentina Donovan, MD        DATE OF BIRTH:  [birth year]   DATE OF SERVICE:                                    CLINIC NOTE   Patient is a 40 year old gravida 4, para 3, 0-1-3 with the youngest child 48  years old, who was referred from the family medicine program because of  severe bleeding and anemia.  The patient states that she has had regular  periods the last several months and not heavy bleeding.  Her hemoglobin when  they saw her was 27.5, hematocrit 7.5.  The hemoglobin has come up since she  used iron, and since the heavy bleeding has stopped, to 10.7 and 35.2.  Was  told at this point she does not seem to need any medication or anything  further.   She had an ultrasound which was virtually normal with the exception of what  looked like a premenstrual endometrium, and the only other thing she is on  is fluoxetine as well as ferrous sulfate.  Fluoxetine for depression, which  she has been on for a month and a half without success.   We are going to recommend that she go to the behavioral health center for  treatment and that she comes back here if the bleeding gets heavy again.  Seems to understand, with Raynelle Fanning being our Nurse, learning disability.           ______________________________  Argentina Donovan, MD     PR/MEDQ  D:  11/20/2005  T:  11/20/2005  Job:  [id number]

## 2011-03-04 IMAGING — US US TRANSVAGINAL NON-OB
1 series · 13 of 25 positions shown · non-contrast
Comparison: 11/24/2007 study.

CLINICAL DATA: History given of pelvic pain and prior hysterectomy.

TRANSABDOMINAL AND TRANSVAGINAL ULTRASOUND OF PELVIS
TECHNIQUE: Both transabdominal and transvaginal ultrasound
examinations of the pelvis were performed including evaluation of
the uterus, ovaries, adnexal regions, and pelvic cul-de-sac.

[Series 1: us transvaginal non-ob · 0.30mm/px · 13 of 43 slices shown]
[im 1/43]
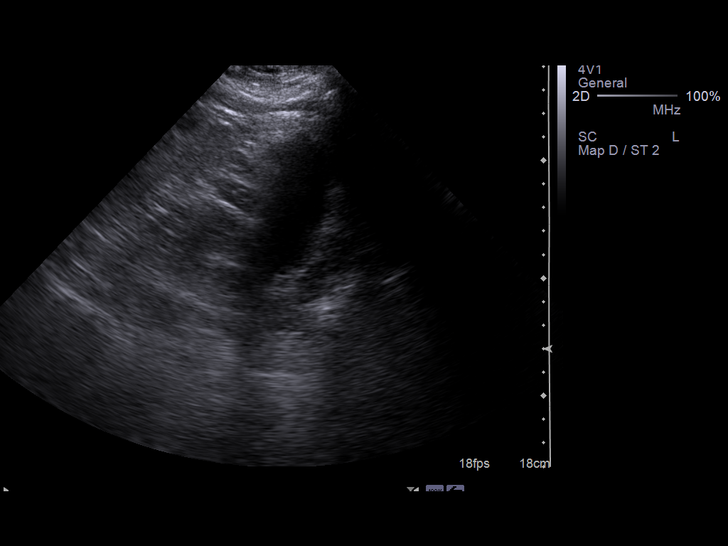
[im 4/43]
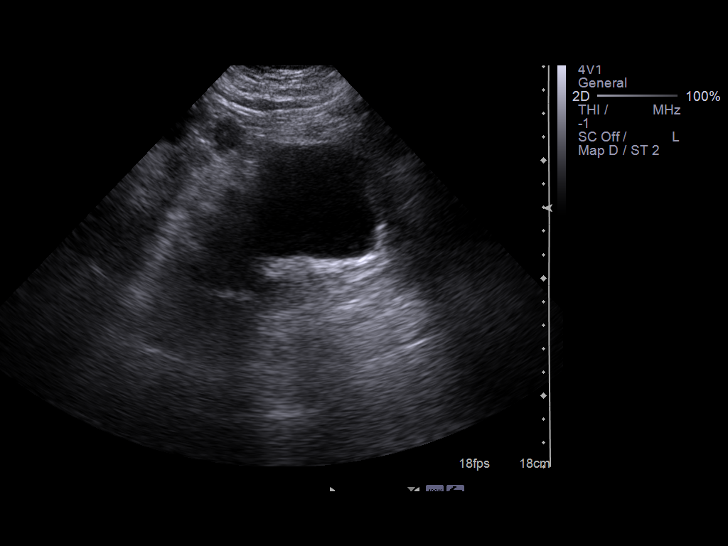
[im 8/43]
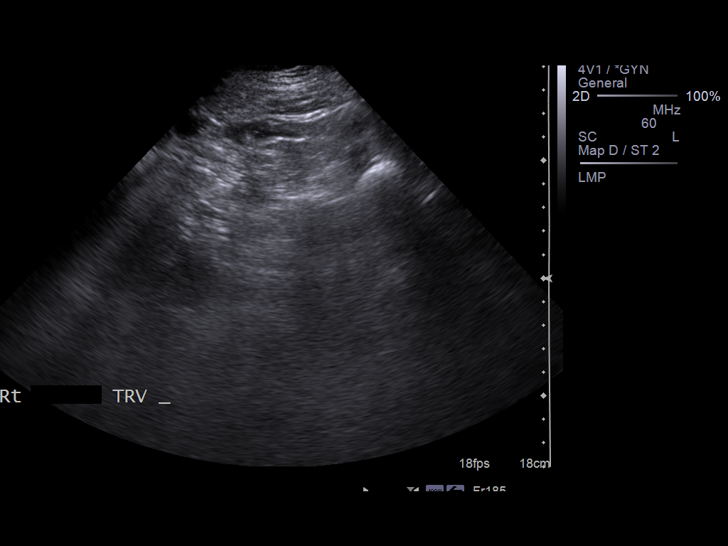
[im 11/43]
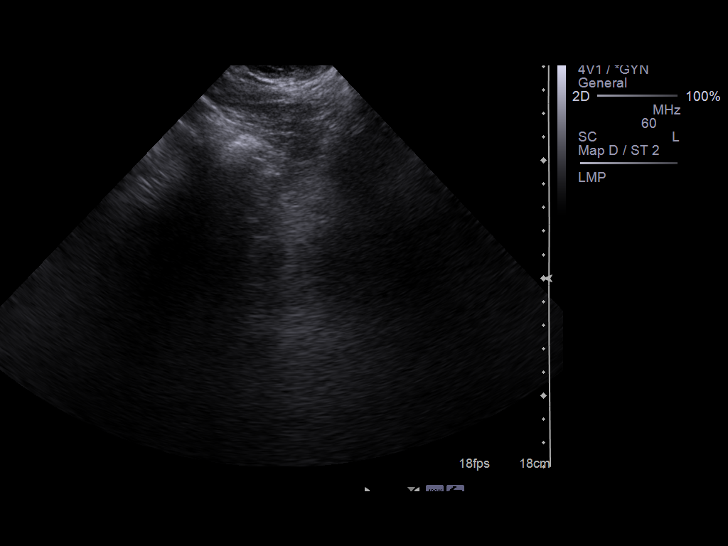
[im 15/43]
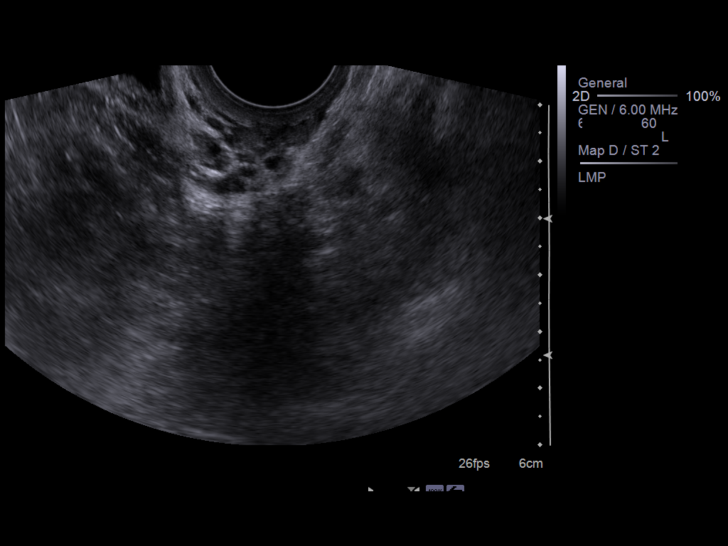
[im 18/43]
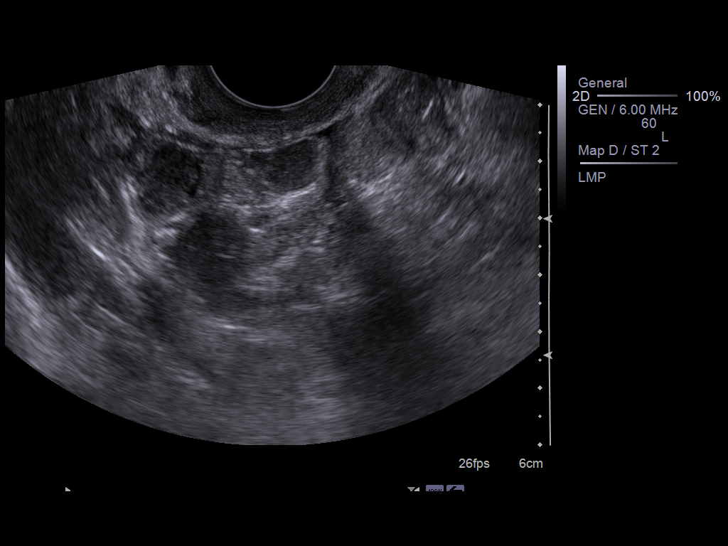
[im 22/43]
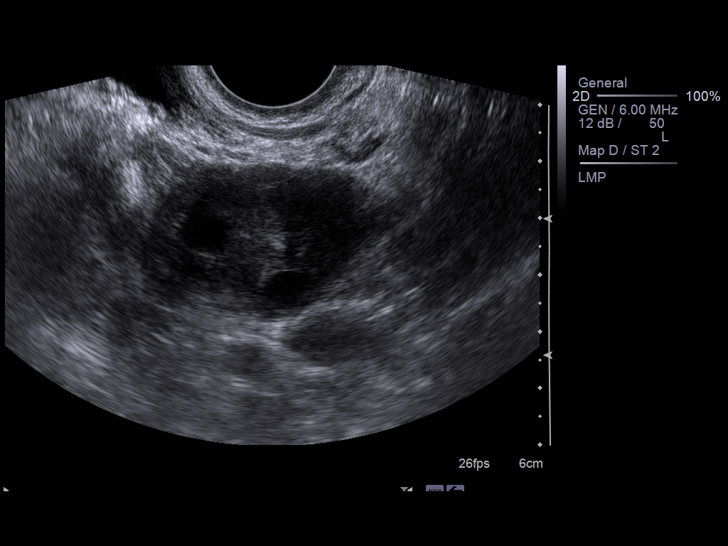
[im 25/43]
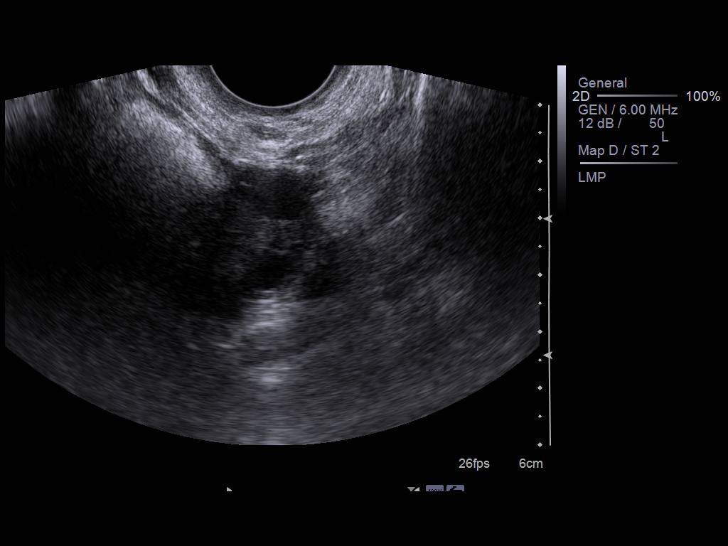
[im 29/43]
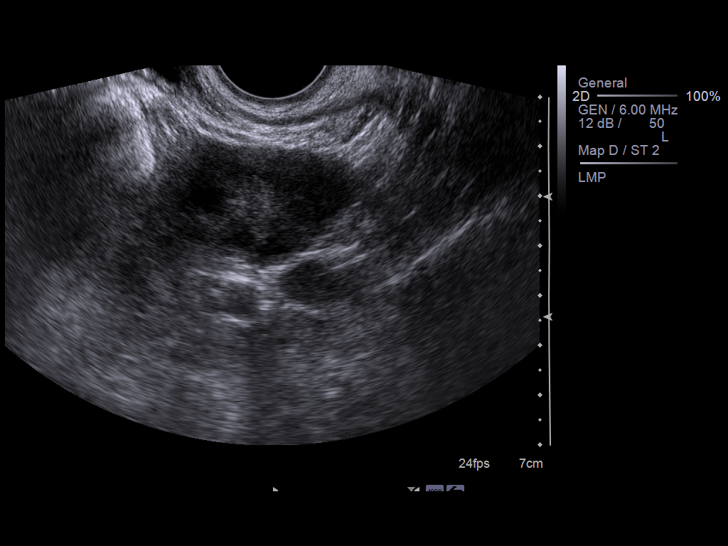
[im 32/43]
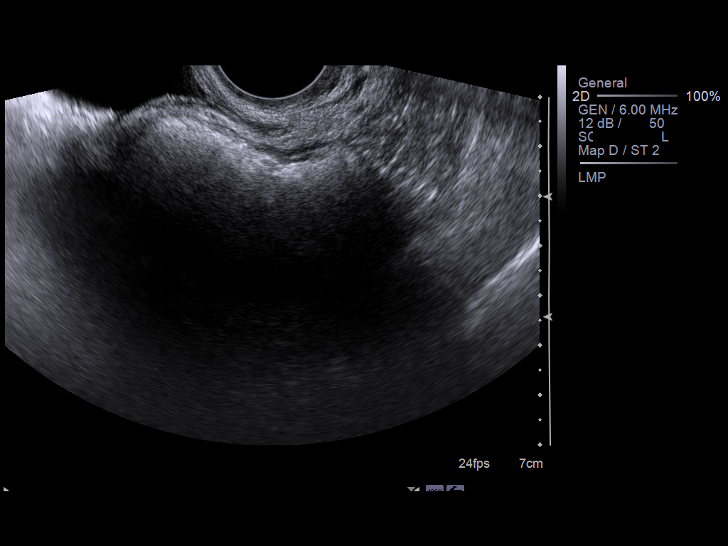
[im 36/43]
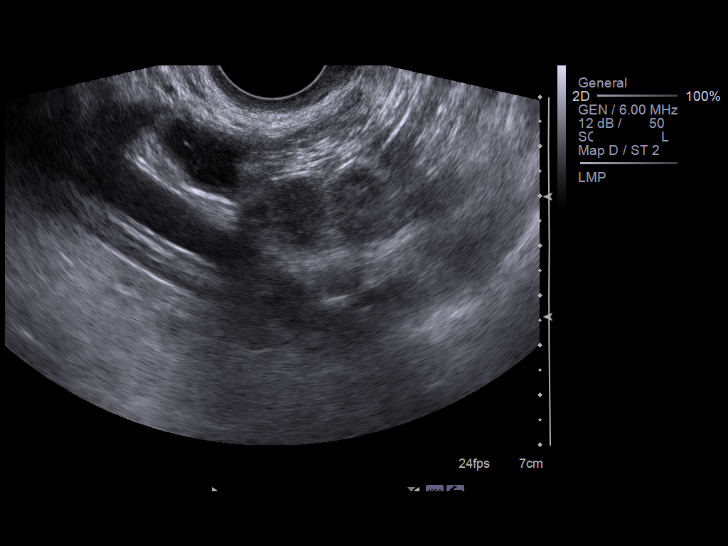
[im 39/43]
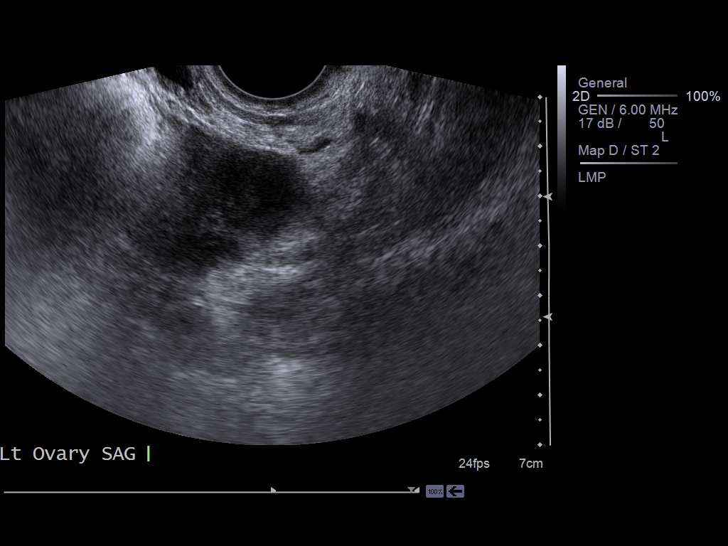
[im 43/43]
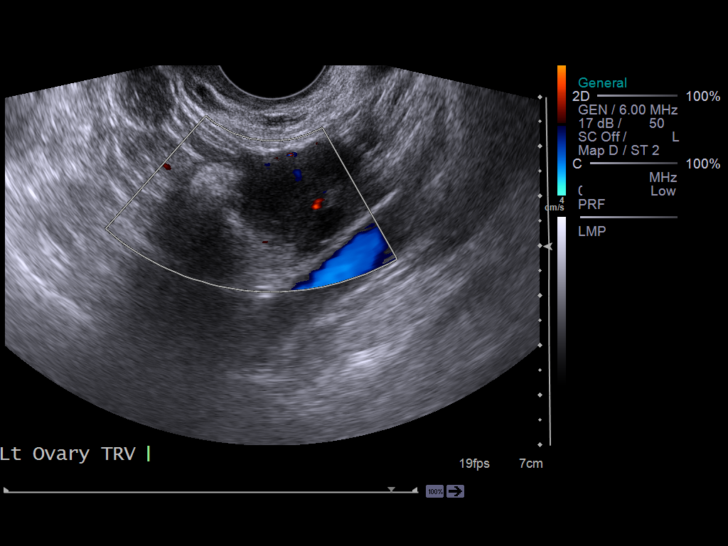

[13 of 25 positions shown; findings below may reference images not displayed]

FINDINGS: Uterus :
Previous hysterectomy has been performed by history and no uterus
was evident.

Endometrium :  Previous hysterectomy has been performed.

Right Ovary could not be visualized being obscured by overlying
bowel gas.

Left Ovary measures 3.9 x 2.6 x 1.0 cm.  Small ovarian cyst
consistent with follicular cysts are seen.  The largest cystic area
measured 1.9 x 1.7 x 1.9 cm. With a few internal echoes.  No solid
nodularity or papillary excrescence is seen.  No internal color
Doppler flow examination was seen.

Other Findings:  There is a trace amount of fluid within the
pelvis.  No hydrosalpinx is evident.
IMPRESSION: Previous hysterectomy has been performed.  A trace amount of free
fluid is seen in the pelvis.  No tubal abnormality is seen.  Right
ovary cannot be visualized.  Small left ovarian cysts are seen
callosum with follicular cyst.  There is a cystic area with a small
amount of debris within it.  It is probably benign characteristics
which are detailed above.  Followup study in 6-12 weeks could be
obtained to assess for stability or resolution.

## 2011-04-28 ENCOUNTER — Encounter: Payer: Self-pay | Admitting: Internal Medicine

## 2011-04-28 ENCOUNTER — Ambulatory Visit (INDEPENDENT_AMBULATORY_CARE_PROVIDER_SITE_OTHER): Payer: Self-pay | Admitting: Internal Medicine

## 2011-04-28 DIAGNOSIS — F329 Major depressive disorder, single episode, unspecified: Secondary | ICD-10-CM

## 2011-04-28 DIAGNOSIS — F32A Depression, unspecified: Secondary | ICD-10-CM

## 2011-04-28 DIAGNOSIS — Z8659 Personal history of other mental and behavioral disorders: Secondary | ICD-10-CM

## 2011-04-28 DIAGNOSIS — R5383 Other fatigue: Secondary | ICD-10-CM

## 2011-04-28 DIAGNOSIS — R5381 Other malaise: Secondary | ICD-10-CM

## 2011-04-28 DIAGNOSIS — K117 Disturbances of salivary secretion: Secondary | ICD-10-CM

## 2011-04-28 DIAGNOSIS — R221 Localized swelling, mass and lump, neck: Secondary | ICD-10-CM

## 2011-04-28 DIAGNOSIS — R220 Localized swelling, mass and lump, head: Secondary | ICD-10-CM

## 2011-04-28 DIAGNOSIS — N898 Other specified noninflammatory disorders of vagina: Secondary | ICD-10-CM

## 2011-04-28 LAB — CBC WITH DIFFERENTIAL/PLATELET
Basophils Relative: 0 % (ref 0–1)
Hemoglobin: 11.9 g/dL — ABNORMAL LOW (ref 12.0–15.0)
Lymphs Abs: 2.4 10*3/uL (ref 0.7–4.0)
MCHC: 32.1 g/dL (ref 30.0–36.0)
Monocytes Relative: 6 % (ref 3–12)
Neutro Abs: 6.9 10*3/uL (ref 1.7–7.7)
Neutrophils Relative %: 69 % (ref 43–77)
RBC: 4.1 MIL/uL (ref 3.87–5.11)
WBC: 10 10*3/uL (ref 4.0–10.5)

## 2011-04-28 MED ORDER — CITALOPRAM HYDROBROMIDE 20 MG PO TABS: 20.0000 mg | ORAL_TABLET | Freq: Every day | ORAL | Status: DC

## 2011-04-28 NOTE — Progress Notes (Signed)
  Subjective:    Patient ID: Lori Murray, female    DOB: July 16, 1970, 40 y.o.   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 She didn't feel like talking to anybody when she visited her family.She states feeling weak and tired all the time. She states that she has not taken any of her antidepressant medications since her trip to the home country [place] back in July 2012.    She states that she continues to have dry mouth and eyes. She has been using artificial teardrops which helps her she was seen by a rheumatologist at [facility] in the last year.  She reports having vaginal discharge for last 2 weeks associated with bad odor and itching. But denies any abdominal pain, nausea, urgency , frequency or dysuria.    Review of Systems  Constitutional: Negative for fever, appetite change and fatigue.  HENT: Positive for congestion and rhinorrhea. Negative for sore throat.   Eyes: Negative for visual disturbance.  Respiratory: Negative for apnea, choking, chest tightness and wheezing.   Cardiovascular: Negative for chest pain, palpitations and leg swelling.  Gastrointestinal: Negative for nausea, abdominal pain, diarrhea and constipation.  Genitourinary: Positive for vaginal discharge. Negative for dysuria and frequency.  Musculoskeletal: Negative for arthralgias.  Neurological: Negative for dizziness and light-headedness.  Hematological: Negative for adenopathy.  Psychiatric/Behavioral: Negative for decreased concentration.       Objective:   Physical Exam  Constitutional: She is oriented to person, place, and time. She appears well-developed and well-nourished.  HENT:  Head: Normocephalic and atraumatic.  Right Ear: External  ear normal.  Left Ear: External ear normal.  Nose: Nose normal.  Mouth/Throat: Oropharynx is clear and moist. No oropharyngeal exudate.  Eyes: Conjunctivae and EOM are normal. Pupils are equal, round, and reactive to light.  Neck: Normal range of motion. No JVD present. No tracheal deviation present. No thyromegaly present.  Cardiovascular: Normal rate, regular rhythm and intact distal pulses.  Exam reveals no gallop and no friction rub.   No murmur heard. Pulmonary/Chest: Effort normal and breath sounds normal. No stridor. No respiratory distress. She has no wheezes. She has no rales. She exhibits no tenderness.  Abdominal: Soft. Bowel sounds are normal. She exhibits no distension and no mass. There is no tenderness. There is no rebound and no guarding.  Musculoskeletal: She exhibits no edema and no tenderness.  Lymphadenopathy:    She has no cervical adenopathy.  Neurological: She is alert and oriented to person, place, and time. She has normal reflexes. She displays normal reflexes. No cranial nerve deficit. Coordination normal.  Skin: Skin is warm and dry. No rash noted. No erythema.          Assessment & Plan:

## 2011-04-28 NOTE — Patient Instructions (Addendum)
Please schedule a follow up appointment in 3 months or earlier if needed. Please take your medicines as prescribed. I will call you with your test results if abnormal.

## 2011-04-29 LAB — COMPLETE METABOLIC PANEL WITH GFR
BUN: 11 mg/dL (ref 6–23)
CO2: 25 mEq/L (ref 19–32)
Creat: 0.59 mg/dL (ref 0.50–1.10)
GFR, Est African American: >89 mL/min
GFR, Est Non African American: >89 mL/min
Glucose, Bld: 71 mg/dL (ref 70–99)
Total Bilirubin: 0.3 mg/dL (ref 0.3–1.2)

## 2011-04-29 LAB — WET PREP BY MOLECULAR PROBE: Candida species: NEGATIVE

## 2011-04-29 LAB — GC/CHLAMYDIA PROBE AMP, GENITAL: Chlamydia, DNA Probe: NEGATIVE

## 2011-04-29 LAB — TSH: TSH: 1.029 u[IU]/mL (ref 0.350–4.500)

## 2011-05-04 NOTE — Assessment & Plan Note (Signed)
She reports some vaginal discharge associated with itching and odor for last few weeks. - will do a wet prep, GC/Chlamydia testing.

## 2011-05-04 NOTE — Assessment & Plan Note (Signed)
She reports feeling depressed lately but has not been taking any of her meds for last 3 months.Denies any suicidal thoughts or ideation. -Will restart her back on celexa. - She was educated that it may take few weeks for medication to start taking its effect.

## 2011-05-15 NOTE — Assessment & Plan Note (Addendum)
She continues to complain of dry mouth and dry eye. Reviewing her previous notes from clinic visits and  [facility] ophhthalmology, it seems like the patient's dry eye was  thought to be secondary to Horner's syndrome that she developed with her Schwanomma( left parapharyngeal mass) resection in 01/11. Sjogren's syndrome was also considered as a differential when she was tested for anti- SSA and anti- SSB antibodies. Her anti-SSA ( Ro)antibody was weakly positive with negative anti- SSB. As per the patient she was seen by a rheumatologist at [facility]. I am not sure if the patient had biopsy of her salivary gland.  Plan: Continue symptomatic treatment with artificial saliva for her dry mouth.           Try to obtain records from [place].

## 2011-05-15 NOTE — Assessment & Plan Note (Addendum)
She continues to complain of tiredness and fatigue. Differentials include worsening of her depression in the setting of not taking her meds regularly versus  iron deficiency anemia ( given the history) versus hypothyroidism versus automimmune disease ( possible Sjogren's given weak positive IgG anti- SSA ( Ro)  2011). She states that she was seen once at [facility] for her rheumatological condition. Plan- Restart her on ant- depressant meds.           Check CBC and TSH.           Try to retrieve old records from [place].

## 2011-07-28 ENCOUNTER — Encounter: Payer: Self-pay | Admitting: Internal Medicine

## 2011-07-29 ENCOUNTER — Ambulatory Visit (INDEPENDENT_AMBULATORY_CARE_PROVIDER_SITE_OTHER): Payer: Self-pay | Admitting: Internal Medicine

## 2011-07-29 ENCOUNTER — Encounter: Payer: Self-pay | Admitting: Internal Medicine

## 2011-07-29 VITALS — BP 115/80 | HR 62 | Temp 97.0°F | Wt 147.5 lb

## 2011-07-29 DIAGNOSIS — M25562 Pain in left knee: Secondary | ICD-10-CM

## 2011-07-29 DIAGNOSIS — Z8659 Personal history of other mental and behavioral disorders: Secondary | ICD-10-CM

## 2011-07-29 DIAGNOSIS — F32A Depression, unspecified: Secondary | ICD-10-CM

## 2011-07-29 DIAGNOSIS — Z780 Asymptomatic menopausal state: Secondary | ICD-10-CM

## 2011-07-29 DIAGNOSIS — R61 Generalized hyperhidrosis: Secondary | ICD-10-CM

## 2011-07-29 DIAGNOSIS — F329 Major depressive disorder, single episode, unspecified: Secondary | ICD-10-CM

## 2011-07-29 DIAGNOSIS — M25569 Pain in unspecified knee: Secondary | ICD-10-CM

## 2011-07-29 DIAGNOSIS — K117 Disturbances of salivary secretion: Secondary | ICD-10-CM

## 2011-07-29 MED ORDER — CITALOPRAM HYDROBROMIDE 20 MG PO TABS: 20.0000 mg | ORAL_TABLET | Freq: Every day | ORAL | Status: DC

## 2011-07-29 NOTE — Progress Notes (Signed)
[name], Lori Murray, spanish interpreter, was present at this patient encounter. 07/29/2011 Dr Dorthula Rue

## 2011-07-29 NOTE — Assessment & Plan Note (Signed)
Denies any depressed mood -states that Celexa is really helping.   -Continue Celexa at current dose.

## 2011-07-29 NOTE — Progress Notes (Signed)
Appt made Dr Gordy Levan 08/18/11 9:30AM [facility] Rheumat[facility] clinic - non insured. Stanton Kidney Faustine Tates RN 07/29/11 2:30PM

## 2011-07-29 NOTE — Assessment & Plan Note (Addendum)
She continues to complain of dry mouth. - Continue symptomatic treatment with artificial saliva for now. She was also advised to put some candies in a mouth that will also help to alleviate her symptoms - She was seen at [facility] about a year and a half ago. Reschedule a followup appointment at [facility] rheumatology .

## 2011-07-29 NOTE — Assessment & Plan Note (Addendum)
She reports having night sweats for the last 2-3 months-to an extent that she drenches her clothes. Differentials include endocrine disorders ( like hyperthyroidism, pheochromocytoma) versus infections like TB(especially when she is from [place] , Brucella, HIV versus atypical hot flashes from menopause versus malignancy(especially lymphoma if she has Sjogren's disease) versus medications like anti- depressants ( TCA's , SSRI or wellbutyrin) versus panic attacks.  At this point etiology is unclear .She states that she had her last period about 3 years ago but she is status post hysterectomy and right salpingo- oophorectomy. The chances of her having atypical hot flushes are less likely  But will investigate for ovarian failure by checking FSH, LH. On my exam she was not found to have any lymphadenopathy, so lymphoma appears less likley.Her TSH was checked about 4 months ago and was within normal limits. She also had her [facility] checked about 2 years ago and was within normal limits. Her HIV testing was done about 4 years ago which was also nonreactive -Check FSH , LH and prolactin levels, HIV. -Obtain chest x-ray to rule out TB.

## 2011-07-29 NOTE — Progress Notes (Signed)
  Subjective:    Patient ID: Lori Murray, female    DOB: 27-Aug-1970, 41 y.o.   MRN: [id number]  HPI: 42 year old hispanic Spanish-speaking woman with past medical history significant for depression comes to the clinic for followup visit.  1)Night sweats: She reports having episodes of night sweats where her clothes will be all drenched in sweats , occurring at a frequency of once per week. They are associated with chest tightness but denies any nausea or vomiting associated with it. These episodes have been occurring for the last 6 months dated back to September but now becoming more frequent .  2) Dry mouth:  She continues to complain of dry mouth which is especially worse at night.   3) Left knee pain: Also reports worsening of her chronic knee pain. She states that for her knee pain she was given steroid shots about 3 years ago which helped her.    Review of Systems  Constitutional: Negative for fever and fatigue.  HENT: Negative for congestion, rhinorrhea and postnasal drip.   Respiratory: Negative for apnea, wheezing and stridor.   Genitourinary: Negative for dysuria.  Musculoskeletal: Negative for arthralgias.  Neurological: Negative for light-headedness, numbness and headaches.  Hematological: Negative for adenopathy.       Objective:   Physical Exam  Constitutional: She is oriented to person, place, and time. She appears well-developed and well-nourished. No distress.       No lymphadenopathy noted.  HENT:  Head: Normocephalic and atraumatic.  Mouth/Throat: No oropharyngeal exudate.  Eyes: Conjunctivae and EOM are normal. Pupils are equal, round, and reactive to light.  Neck: Normal range of motion. Neck supple. No JVD present. No tracheal deviation present. No thyromegaly present.  Cardiovascular: Normal rate and regular rhythm.   Pulmonary/Chest: Effort normal and breath sounds normal. No stridor. No respiratory distress. She has no wheezes. She has no rales.    Abdominal: Soft. Bowel sounds are normal. She exhibits no distension. There is no tenderness. There is no rebound.  Neurological: She is alert and oriented to person, place, and time. She has normal reflexes. She displays normal reflexes. No cranial nerve deficit. Coordination normal.  Skin: She is not diaphoretic.          Assessment & Plan:

## 2011-07-29 NOTE — Patient Instructions (Signed)
Please schedule a follow up appointment in 3-4 months or sooner if needed. . Please bring your medication bottles with your next appointment. Please take your medicines as prescribed.

## 2011-07-30 ENCOUNTER — Encounter: Payer: Self-pay | Admitting: Internal Medicine

## 2011-07-30 ENCOUNTER — Encounter (INDEPENDENT_AMBULATORY_CARE_PROVIDER_SITE_OTHER): Payer: Self-pay | Admitting: Internal Medicine

## 2011-07-30 ENCOUNTER — Ambulatory Visit (HOSPITAL_COMMUNITY)
Admission: RE | Admit: 2011-07-30 | Discharge: 2011-07-30 | Disposition: A | Discharge status: Home / Self Care | Payer: Self-pay | Source: Ambulatory Visit | Attending: Internal Medicine | Admitting: Internal Medicine

## 2011-07-30 DIAGNOSIS — R61 Generalized hyperhidrosis: Secondary | ICD-10-CM

## 2011-07-30 DIAGNOSIS — M25562 Pain in left knee: Secondary | ICD-10-CM | POA: Insufficient documentation

## 2011-07-30 DIAGNOSIS — R0602 Shortness of breath: Secondary | ICD-10-CM | POA: Insufficient documentation

## 2011-07-30 LAB — LUTEINIZING HORMONE: LH: 3.6 m[IU]/mL

## 2011-07-30 NOTE — Assessment & Plan Note (Signed)
She reports worsening of her chronic left knee pain I think this could be early degenerative arthritis. -Continue to monitor. -Discussed symptomatic treatment with the pain meds when she has severe pain.

## 2011-07-31 LAB — PROLACTIN: Prolactin: 6.6 ng/mL

## 2011-10-02 ENCOUNTER — Ambulatory Visit (INDEPENDENT_AMBULATORY_CARE_PROVIDER_SITE_OTHER): Payer: Self-pay | Admitting: Internal Medicine

## 2011-10-02 VITALS — BP 114/73 | HR 75 | Temp 98.2°F | Wt 132.9 lb

## 2011-10-02 DIAGNOSIS — H6590 Unspecified nonsuppurative otitis media, unspecified ear: Secondary | ICD-10-CM

## 2011-10-02 DIAGNOSIS — F32A Depression, unspecified: Secondary | ICD-10-CM

## 2011-10-02 DIAGNOSIS — F329 Major depressive disorder, single episode, unspecified: Secondary | ICD-10-CM

## 2011-10-02 MED ORDER — VENLAFAXINE HCL ER 75 MG PO CP24: 75.0000 mg | ORAL_CAPSULE | Freq: Every day | ORAL | Status: DC

## 2011-10-02 MED ORDER — LORATADINE-PSEUDOEPHEDRINE ER 5-120 MG PO TB12: 1.0000 | ORAL_TABLET | Freq: Two times a day (BID) | ORAL | Status: AC

## 2011-10-02 MED ORDER — AZITHROMYCIN 250 MG PO TABS: ORAL_TABLET | ORAL | Status: AC

## 2011-10-02 NOTE — Patient Instructions (Signed)
Por favor, sttop tomando citalopram y Corporate investment banker a Optician, dispensing. Effexor le ayudar con la depresin y sudores.Por favor, seguimiento con nuestro trabajador social Sra. Grady en lo que respecta a su remisin a Risk manager. Por favor, llame con cualquier pregunta. Earney Navy!!!

## 2011-10-02 NOTE — Progress Notes (Signed)
Patient ID: Lori Murray, female   DOB: [birth year], 41 y.o.   MRN: [id number] HPI:    1. Left ear ache with radiation down to her left neck. Denies any fever, chills, trauma. 2. Hot flashes. Patient has had a TAH several years go due to DUB. 3. Depression. Patient denies any SI/HI or mania. Reports lack of energy, lack of libido, diarrhea, body aches "all over." Patient is on Citalopram for 7 months. Review of Systems: Negative except per history of present illness  Physical Exam:  Nursing notes and vitals reviewed General:  alert, well-developed, and cooperative to examination.   Lungs:  normal respiratory effort, no accessory muscle use, normal breath sounds, no crackles, and no wheezes. Heart:  normal rate, regular rhythm, no murmurs, no gallop, and no rub.   Abdomen:  soft, non-tender, normal bowel sounds, no distention, no guarding, no rebound tenderness, no hepatomegaly, and no splenomegaly.   Extremities:  No cyanosis, clubbing, edema Neurologic:  alert & oriented X3, nonfocal exam  Meds:  (Not in a hospital admission)  Allergies: Review of patient's allergies indicates no known allergies. Past Medical History  Diagnosis Date  . History of tension headache   . History of tobacco abuse   . Gardnerella vaginalis infection     recurrent  . Metrorrhagia     h/o treated with OCP.  Marland Kitchen Depression   . Anemia     iron deficiency  . Fibroids     s/p TAH- right salpingo- oophorectomy in 2010  . 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 followed at [facility] center; LUL ptosis 2/2 Horner's; f/u with Dr. Corinne Ports   Past Surgical History  Procedure Date  . Benign schwannoma  06/20/09    excision from left neck, by Dr. Reine [facility])   Family History  Problem Relation Age of Onset  . Diabetes Neg Hx   . Hypertension Neg Hx   . Depression Neg Hx    History   Social History  . Marital Status: Legally Separated    Spouse Name: N/A    Number  of Children: N/A  . Years of Education: N/A   Occupational History  . Not on file.   Social History Main Topics  . Smoking status: Former Games developer  . Smokeless tobacco: Not on file  . Alcohol Use:   . Drug Use:   . Sexually Active:    Other Topics Concern  . Not on file   Social History Narrative   Regular exercise- yes.   A/P: 1.  Left OME -Z-pack - Claritin D -avoid exposure to cold temperatures and smoking  2. Depression without SI, HI or mania -d/c celexa -start Effexor  3. Surgical menopause - Effexor for vasomotor symptoms

## 2011-10-03 ENCOUNTER — Encounter: Payer: Self-pay | Admitting: Internal Medicine

## 2011-10-06 ENCOUNTER — Telehealth: Payer: Self-pay | Admitting: Licensed Clinical Social Worker

## 2011-10-06 NOTE — Telephone Encounter (Signed)
CSW attempted to contact pt to provide referral information to [place] and [employer] of [place].  Unable to reach pt at work until 4pm and left message at home number requesting return call.

## 2011-10-06 NOTE — Telephone Encounter (Signed)
Pt referred to CSW for referral to mental health therapist/psychiatrist, Spanish speaking.  CSW called [facility] to inquire if there is a specific Spanish speaking mental health providers.  Will await return call.  If no return call or provider, CSW will refer to [facility] and [facility] of [place].

## 2011-10-07 NOTE — Telephone Encounter (Signed)
CSW attempted to contact pt via telephone utilizing interpreter.  Left message indicating CSW is putting information in the mail for pt.  Left name and contact information.  CSW placed Spanish version of [employer] and Spanish providers from [facility] [place] in the mail to patient.

## 2011-12-29 ENCOUNTER — Emergency Department (HOSPITAL_COMMUNITY): Payer: Self-pay

## 2011-12-29 ENCOUNTER — Emergency Department (EMERGENCY_DEPARTMENT_HOSPITAL)
Admission: EM | Admit: 2011-12-29 | Discharge: 2011-12-29 | Disposition: A | Discharge status: Home / Self Care | Payer: Self-pay | Source: Home / Self Care | Attending: Emergency Medicine | Admitting: Emergency Medicine

## 2011-12-29 ENCOUNTER — Encounter (HOSPITAL_COMMUNITY): Payer: Self-pay | Admitting: Emergency Medicine

## 2011-12-29 ENCOUNTER — Emergency Department (HOSPITAL_COMMUNITY): Payer: Self-pay | Attending: Emergency Medicine

## 2011-12-29 DIAGNOSIS — R29810 Facial weakness: Secondary | ICD-10-CM | POA: Insufficient documentation

## 2011-12-29 DIAGNOSIS — R5381 Other malaise: Secondary | ICD-10-CM | POA: Insufficient documentation

## 2011-12-29 DIAGNOSIS — R51 Headache: Secondary | ICD-10-CM | POA: Insufficient documentation

## 2011-12-29 DIAGNOSIS — G9389 Other specified disorders of brain: Secondary | ICD-10-CM | POA: Insufficient documentation

## 2011-12-29 DIAGNOSIS — M542 Cervicalgia: Secondary | ICD-10-CM

## 2011-12-29 DIAGNOSIS — R4789 Other speech disturbances: Secondary | ICD-10-CM | POA: Insufficient documentation

## 2011-12-29 DIAGNOSIS — G8190 Hemiplegia, unspecified affecting unspecified side: Secondary | ICD-10-CM

## 2011-12-29 DIAGNOSIS — R5383 Other fatigue: Secondary | ICD-10-CM | POA: Insufficient documentation

## 2011-12-29 DIAGNOSIS — Z79899 Other long term (current) drug therapy: Secondary | ICD-10-CM | POA: Insufficient documentation

## 2011-12-29 DIAGNOSIS — R29898 Other symptoms and signs involving the musculoskeletal system: Secondary | ICD-10-CM | POA: Insufficient documentation

## 2011-12-29 DIAGNOSIS — R200 Anesthesia of skin: Secondary | ICD-10-CM

## 2011-12-29 DIAGNOSIS — R0789 Other chest pain: Secondary | ICD-10-CM | POA: Insufficient documentation

## 2011-12-29 DIAGNOSIS — R209 Unspecified disturbances of skin sensation: Secondary | ICD-10-CM | POA: Insufficient documentation

## 2011-12-29 LAB — TROPONIN I: Troponin I: <0.3 ng/mL (ref <0.30)

## 2011-12-29 LAB — COMPREHENSIVE METABOLIC PANEL
ALT: 10 U/L (ref 0–35)
AST: 17 U/L (ref 0–37)
Albumin: 4 g/dL (ref 3.5–5.2)
Alkaline Phosphatase: 106 U/L (ref 39–117)
BUN: 11 mg/dL (ref 6–23)
CO2: 18 mEq/L — ABNORMAL LOW (ref 19–32)
Calcium: 9.3 mg/dL (ref 8.4–10.5)
Chloride: 104 mEq/L (ref 96–112)
Creatinine, Ser: 0.69 mg/dL (ref 0.50–1.10)
GFR calc Af Amer: >90 mL/min (ref >90)
GFR calc non Af Amer: >90 mL/min (ref >90)
Glucose, Bld: 85 mg/dL (ref 70–99)
Potassium: 3.4 mEq/L — ABNORMAL LOW (ref 3.5–5.1)
Sodium: 137 mEq/L (ref 135–145)
Total Bilirubin: 0.3 mg/dL (ref 0.3–1.2)
Total Protein: 8.3 g/dL (ref 6.0–8.3)

## 2011-12-29 LAB — CBC
HCT: 41.1 % (ref 36.0–46.0)
Hemoglobin: 13.8 g/dL (ref 12.0–15.0)
MCH: 30.8 pg (ref 26.0–34.0)
MCHC: 33.6 g/dL (ref 30.0–36.0)
MCV: 91.7 fL (ref 78.0–100.0)
Platelets: 289 10*3/uL (ref 150–400)
RBC: 4.48 MIL/uL (ref 3.87–5.11)
RDW: 13.2 % (ref 11.5–15.5)
WBC: 13.8 10*3/uL — ABNORMAL HIGH (ref 4.0–10.5)

## 2011-12-29 LAB — CK TOTAL AND CKMB (NOT AT ARMC)
CK, MB: 1.2 ng/mL (ref 0.3–4.0)
Relative Index: INVALID (ref 0.0–2.5)
Total CK: 60 U/L (ref 7–177)

## 2011-12-29 LAB — URINALYSIS, ROUTINE W REFLEX MICROSCOPIC
Bilirubin Urine: NEGATIVE
Leukocytes, UA: NEGATIVE
Nitrite: NEGATIVE
Specific Gravity, Urine: 1.029 (ref 1.005–1.030)
pH: 6.5 (ref 5.0–8.0)

## 2011-12-29 LAB — DIFFERENTIAL
Basophils Absolute: 0 10*3/uL (ref 0.0–0.1)
Basophils Relative: 0 % (ref 0–1)
Eosinophils Absolute: 0.1 10*3/uL (ref 0.0–0.7)
Eosinophils Relative: 1 % (ref 0–5)
Lymphocytes Relative: 30 % (ref 12–46)
Lymphs Abs: 4.1 10*3/uL — ABNORMAL HIGH (ref 0.7–4.0)
Monocytes Absolute: 0.8 10*3/uL (ref 0.1–1.0)
Monocytes Relative: 6 % (ref 3–12)
Neutro Abs: 8.8 10*3/uL — ABNORMAL HIGH (ref 1.7–7.7)
Neutrophils Relative %: 64 % (ref 43–77)

## 2011-12-29 LAB — GLUCOSE, CAPILLARY: Glucose-Capillary: 71 mg/dL (ref 70–99)

## 2011-12-29 LAB — RAPID URINE DRUG SCREEN, HOSP PERFORMED
Barbiturates: NOT DETECTED
Benzodiazepines: NOT DETECTED
Cocaine: NOT DETECTED

## 2011-12-29 LAB — POCT PREGNANCY, URINE: Preg Test, Ur: NEGATIVE

## 2011-12-29 LAB — PROTIME-INR
INR: 0.92 (ref 0.00–1.49)
Prothrombin Time: 12.6 seconds (ref 11.6–15.2)

## 2011-12-29 LAB — APTT: aPTT: 31 seconds (ref 24–37)

## 2011-12-29 MED ORDER — IOHEXOL 350 MG/ML SOLN
80.0000 mL | Freq: Once | INTRAVENOUS | Status: AC | PRN
  Administered 2011-12-29: 80 mL via INTRAVENOUS

## 2011-12-29 MED ORDER — KETOROLAC TROMETHAMINE 30 MG/ML IJ SOLN
30.0000 mg | Freq: Once | INTRAMUSCULAR | Status: AC
  Administered 2011-12-29: 30 mg via INTRAVENOUS
  Filled 2011-12-29: qty 1

## 2011-12-29 NOTE — ED Notes (Signed)
Pt st's numbness in left side has subsided.  C/O pain in left side of face and neck.  Painful to turn head

## 2011-12-29 NOTE — ED Notes (Signed)
Pt to CT via stretcher

## 2011-12-29 NOTE — ED Notes (Addendum)
Per pt's daughter (pt speaks some english) about 30 minutes ago 14:45, pt started to c/o SOB, L chest pain/shoulder pain; pt then started to have L sided numbness; stroke scale done; pt with L sided facial asymmetry, L sided arm drift; L hand grip weaker than R; pt lethargic; daughter also reports that pt is having blurred vision

## 2011-12-29 NOTE — Consult Note (Signed)
Referring Physician: Juleen China    Chief Complaint: Left neck pain  HPI: Lori Murray is an 41 y.o. female who was at home today and had acute onset left neck pain-unable to look to the left.  Patient then developed left sided numbness and weakness.  Patient presented to the ED and code stroke was called.  Head CT was performed and showed no acute abnormalities.     LSN: [id number] tPA Given: No: Not felt to be a stroke  History reviewed. No pertinent past medical history.  Past Surgical History  Procedure Date  . Tumor removal   . Oophorectomy     L    History reviewed. No pertinent family history. Social History:  does not have a smoking history on file. She does not have any smokeless tobacco history on file. She reports that she does not drink alcohol. Her drug history not on file.  Allergies: No Known Allergies  Medications: I have reviewed the patient's current medications. Prior to Admission:  Venlafaxine XR (EFFEXOR-XR) 75 MG 24 hr capsule, Take 75 mg by mouth daily., Disp: , Rfl:   ROS: History obtained from the patient  General ROS: negative for - chills, fatigue, fever, night sweats, weight gain or weight loss Psychological ROS: negative for - behavioral disorder, hallucinations, memory difficulties, mood swings or suicidal ideation Ophthalmic ROS: negative for - blurry vision, double vision, eye pain or loss of vision ENT ROS: negative for - epistaxis, nasal discharge, oral lesions, sore throat, tinnitus or vertigo Allergy and Immunology ROS: negative for - hives or itchy/watery eyes Hematological and Lymphatic ROS: negative for - bleeding problems, bruising or swollen lymph nodes Endocrine ROS: negative for - galactorrhea, hair pattern changes, polydipsia/polyuria or temperature intolerance Respiratory ROS: negative for - cough, hemoptysis, shortness of breath or wheezing Cardiovascular ROS: negative for - chest pain, dyspnea on exertion, edema or irregular  heartbeat Gastrointestinal ROS: negative for - abdominal pain, diarrhea, hematemesis, nausea/vomiting or stool incontinence Genito-Urinary ROS: negative for - dysuria, hematuria, incontinence or urinary frequency/urgency Musculoskeletal ROS: as noted in HPI Neurological ROS: as noted in HPI Dermatological ROS: negative for rash and skin lesion changes  Physical Examination: Blood pressure 120/81, pulse 62, temperature 99 F (37.2 C), temperature source Oral, resp. rate 14, SpO2 100.00%.  Neurologic Examination: Mental Status: Alert, oriented, thought content appropriate.  Speech fluent without evidence of aphasia.  Able to follow 3 step commands without difficulty. Cranial Nerves: II: visual fields grossly normal, pupil slightly larger on the right, round, reactive to light and accommodation III,IV, VI: ptosis not present, extra-ocular motions intact bilaterally V,VII: smile symmetric, facial light touch sensation normal bilaterally VIII: hearing normal bilaterally IX,X: gag reflex present XI: trapezius strength/neck flexion strength normal bilaterally XII: tongue strength normal  Motor: Right : Upper extremity   5/5      Lower extremity   5/5      Patient did not spontaneously move the left but when the arm was lifted and raised above her head she let it fall slowly to her chest or to the bed.  When the leg was lifted she was able to hold it outstretched with out assistance at full strength.  With painful stimulation moves the left upper extremity spontaneously and against gravity Tone and bulk:normal tone throughout; no atrophy noted Sensory: Pinprick and light touch decreased on the left that split the midline Deep Tendon Reflexes: 2+ and symmetric throughout Plantars: Right: downgoing   Left: downgoing Cerebellar: Unable to perform  Laboratory  Studies:  Basic Metabolic Panel:  Lab 12/29/11 1610  NA 137  K 3.4*  CL 104  CO2 18*  GLUCOSE 85  BUN 11  CREATININE 0.69    CALCIUM 9.3  MG --  PHOS --    Liver Function Tests:  Lab 12/29/11 1544  AST 17  ALT 10  ALKPHOS 106  BILITOT 0.3  PROT 8.3  ALBUMIN 4.0   No results found for this basename: LIPASE:5,AMYLASE:5 in the last 168 hours No results found for this basename: AMMONIA:3 in the last 168 hours  CBC:  Lab 12/29/11 1544  WBC 13.8*  NEUTROABS 8.8*  HGB 13.8  HCT 41.1  MCV 91.7  PLT 289    Cardiac Enzymes:  Lab 12/29/11 1544  CKTOTAL 60  CKMB 1.2  CKMBINDEX --  TROPONINI <0.30    BNP: No components found with this basename: POCBNP:5  CBG:  Lab 12/29/11 1551  GLUCAP 71    Microbiology: No results found for this or any previous visit.  Coagulation Studies:  Basename 12/29/11 1544  LABPROT 12.6  INR 0.92    Urinalysis: No results found for this basename: COLORURINE:2,APPERANCEUR:2,LABSPEC:2,PHURINE:2,GLUCOSEU:2,HGBUR:2,BILIRUBINUR:2,KETONESUR:2,PROTEINUR:2,UROBILINOGEN:2,NITRITE:2,LEUKOCYTESUR:2 in the last 168 hours  Lipid Panel: No results found for this basename: chol, trig, hdl, cholhdl, vldl, ldlcalc    HgbA1C:  No results found for this basename: HGBA1C    Urine Drug Screen:   No results found for this basename: labopia, cocainscrnur, labbenz, amphetmu, thcu, labbarb    Alcohol Level: No results found for this basename: ETH:2 in the last 168 hours  Imaging: Ct Head Wo Contrast  12/29/2011  *RADIOLOGY REPORT*  Clinical Data:  Sudden onset of chest pain with left arm weakness and left facial droop.  Code stroke.  CT HEAD WITHOUT CONTRAST  Technique:  Contiguous axial images were obtained from the base of the skull through the vertex without contrast  Comparison:  None.  Findings:  The brain has a normal appearance without evidence for hemorrhage, acute infarction, hydrocephalus, or mass lesion.  There is no extra axial fluid collection.  The skull and paranasal sinuses are normal.  Scattered throughout the right hemisphere are at least three punctate 1 mm  or smaller calcifications, near the corticomedullary junction of the frontal, insular, and occipital lobes (image 10, 13, 14) which are nonspecific.  There is no surrounding edema. These could represent sequelae of remote trauma or infection.  IMPRESSION:  No evidence for acute stroke or bleed.  At least three tiny calcifications in the right hemisphere, nonspecific.  No evidence for acute process or surrounding edema.  Critical Value/emergent results were called by telephone at the time of interpretation on 12/29/2011 at 3:49 p.m. to Dr. Thad Ranger, who verbally acknowledged these results.  Original Report Authenticated By: Elsie Stain, M.D.   Ct Angio Neck W/cm &/or Wo/cm  12/29/2011  *RADIOLOGY REPORT*  Clinical Data:  Acute onset of left-sided chest pain and shoulder pain extending into the left neck.  Left-sided numbness and facial asymmetry.  CT ANGIOGRAPHY NECK  Technique:  Multidetector CT imaging of the neck was performed using the standard protocol during bolus administration of intravenous contrast.  Multiplanar CT image reconstructions including MIPs were obtained to evaluate the vascular anatomy. Carotid stenosis measurements (when applicable) are obtained utilizing NASCET criteria, using the distal internal carotid diameter as the denominator.  Contrast: 80mL OMNIPAQUE IOHEXOL 350 MG/ML SOLN  Comparison:   CT head without contrast 12/29/2011.  Findings:   A standard three-vessel arch configuration is present. The vertebral  arteries both originate from the subclavian arteries without significant stenoses.  The vertebral arteries are codominant in the neck.  The vertebral basilar junction is within normal limits.  The visualized basilar artery is normal.  The right common carotid artery is within normal limits.  The bifurcation is unremarkable.  The cervical right internal carotid artery is normal.  The left common carotid artery is within normal limits.  The bifurcation is unremarkable.  The  cervical left internal carotid artery is normal.  The soft tissues of the neck are within normal limits.  The bone windows are unremarkable.   Review of the MIP images confirms the above findings.  IMPRESSION: 1.  Normal appearance of the cervical vasculature. 2.  No acute or focal abnormality to explain the patients symptoms.  Original Report Authenticated By: Jamesetta Orleans. MATTERN, M.D.    Assessment: 41 y.o. female presenting with complaints of left sided numbness and weakness and left neck pain.  CT of the head and CTA of the neck unremarkable.  Symptoms not consistent since pain and complaints are on the same side.  Exam functional as well.  Patient without risk factors.  Stroke Risk Factors - none  Plan: 1. May give analgesics for pain 2.  No further neurologic intervention is recommended at this time.  If further questions arise, please call or page at that time.  Thank you for allowing neurology to participate in the care of this patient.   Thana Farr, MD [facility] [phone] 12/29/2011, 6:15 PM

## 2011-12-29 NOTE — ED Notes (Signed)
CBG 71 @ 1550

## 2011-12-29 NOTE — Code Documentation (Signed)
Code Stroke called at 1527 Patient arrival 1516 EDP exam 1530 Stroke team arrival 1533 LSN 1445 Pt arrival in CT 1531 Phlebotomist arrival 1544  Code Stroke canceled 1605

## 2011-12-29 NOTE — ED Notes (Signed)
NIH scale completed by stroke nurses.

## 2011-12-29 NOTE — ED Notes (Addendum)
Called code stroke at 15:27, pt to EDP (Dr Judd Lien) at 15:30 for exam- cleared pt for CT; pt arrived to CT scan at 15:31- transported with RN and EMT; Stroke team arrived once in CT at 15:33

## 2011-12-29 NOTE — ED Notes (Signed)
Pt taken to ekg room with NT.

## 2011-12-29 NOTE — ED Provider Notes (Signed)
History     CSN: [id number]  Arrival date & time 12/29/11  1516   First MD Initiated Contact with Patient 12/29/11 1551      Chief Complaint  Patient presents with  . Code Stroke    (Consider location/radiation/quality/duration/timing/severity/associated sxs/prior treatment) Patient is a 41 y.o. female presenting with Acute Neurological Problem. The history is provided by the patient and a relative. The history is limited by a language barrier.  Cerebrovascular Accident This is a new problem. The current episode started today. The problem occurs constantly. The problem has been unchanged. Associated symptoms include chest pain, neck pain, numbness and weakness. Pertinent negatives include no abdominal pain, chills, congestion, coughing, diaphoresis, fatigue, fever, headaches, nausea, rash, sore throat or vomiting. Nothing aggravates the symptoms. She has tried nothing for the symptoms. The treatment provided no relief.    History reviewed. No pertinent past medical history.  Past Surgical History  Procedure Date  . Tumor removal   . Oophorectomy     L    History reviewed. No pertinent family history.  History  Substance Use Topics  . Smoking status: Not on file  . Smokeless tobacco: Not on file  . Alcohol Use: No    OB History    Grav Para Term Preterm Abortions TAB SAB Ect Mult Living                  Review of Systems  Constitutional: Negative for fever, chills, diaphoresis and fatigue.  HENT: Positive for neck pain. Negative for ear pain, congestion, sore throat, facial swelling, mouth sores, trouble swallowing and neck stiffness.   Eyes: Negative.   Respiratory: Negative for apnea, cough, chest tightness, shortness of breath and wheezing.   Cardiovascular: Positive for chest pain. Negative for palpitations and leg swelling.  Gastrointestinal: Negative for nausea, vomiting, abdominal pain, diarrhea and abdominal distention.  Genitourinary: Negative for hematuria,  flank pain, vaginal discharge, difficulty urinating and menstrual problem.  Musculoskeletal: Negative for back pain and gait problem.  Skin: Negative for rash and wound.  Neurological: Positive for speech difficulty, weakness and numbness. Negative for dizziness, tremors, seizures, syncope, facial asymmetry and headaches.  Psychiatric/Behavioral: Negative.   All other systems reviewed and are negative.    Allergies  Review of patient's allergies indicates no known allergies.  Home Medications   Current Outpatient Rx  Name Route Sig Dispense Refill  . VENLAFAXINE HCL ER 75 MG PO CP24 Oral Take 75 mg by mouth daily.      BP 148/89  Pulse 81  Temp 97.6 F (36.4 C) (Oral)  Resp 24  SpO2 100%  Physical Exam  Nursing note and vitals reviewed. Constitutional: She is oriented to person, place, and time. She appears well-developed and well-nourished. No distress.  HENT:  Head: Normocephalic and atraumatic.  Right Ear: External ear normal.  Left Ear: External ear normal.  Nose: Nose normal.  Mouth/Throat: Oropharynx is clear and moist. No oropharyngeal exudate.  Eyes: Conjunctivae and EOM are normal. Pupils are equal, round, and reactive to light. Right eye exhibits no discharge. Left eye exhibits no discharge.  Neck: Normal range of motion. Neck supple. No JVD present. No tracheal deviation present. No thyromegaly present.  Cardiovascular: Normal rate, regular rhythm, normal heart sounds and intact distal pulses.  Exam reveals no gallop and no friction rub.   No murmur heard. Pulmonary/Chest: Effort normal and breath sounds normal. No respiratory distress. She has no wheezes. She has no rales. She exhibits no tenderness.  Abdominal: Soft.  Bowel sounds are normal. She exhibits no distension. There is no tenderness. There is no rebound and no guarding.  Musculoskeletal: Normal range of motion.  Lymphadenopathy:    She has no cervical adenopathy.  Neurological: She is alert and  oriented to person, place, and time. No cranial nerve deficit. Coordination normal. GCS eye subscore is 4. GCS verbal subscore is 5. GCS motor subscore is 6.       Patient says she has trouble feeling pinprick on left hemibody. Patient describes weakness of left arm and left leg but when left hand held over patient's face she is able to prevent it from falling on herself. Her left leg listed at the thigh patient able to maintain elevated distal leg indicative of her quadriceps able to work. Patient withdraws to pain the left side downgoing Babinski normal DTRs no clonus. Patient responds to people speaking on her left side but says she has trouble looking away because the pain. Patient also complains of chest pain. Patient unwilling to say her name or what month it is but gives detailed oriented responses a questions. Feels a sensation that is hard to speak though speaks fluidly to other questions.  Skin: Skin is warm. No rash noted. She is not diaphoretic.  Psychiatric: She has a normal mood and affect. Her behavior is normal. Judgment and thought content normal.    ED Course  Procedures (including critical care time)  Labs Reviewed  CBC - Abnormal; Notable for the following:    WBC 13.8 (*)     All other components within normal limits  DIFFERENTIAL - Abnormal; Notable for the following:    Neutro Abs 8.8 (*)     Lymphs Abs 4.1 (*)     All other components within normal limits  PROTIME-INR  APTT  GLUCOSE, CAPILLARY  COMPREHENSIVE METABOLIC PANEL  CK TOTAL AND CKMB  TROPONIN I   Ct Head Wo Contrast  12/29/2011  *RADIOLOGY REPORT*  Clinical Data:  Sudden onset of chest pain with left arm weakness and left facial droop.  Code stroke.  CT HEAD WITHOUT CONTRAST  Technique:  Contiguous axial images were obtained from the base of the skull through the vertex without contrast  Comparison:  None.  Findings:  The brain has a normal appearance without evidence for hemorrhage, acute infarction,  hydrocephalus, or mass lesion.  There is no extra axial fluid collection.  The skull and paranasal sinuses are normal.  Scattered throughout the right hemisphere are at least three punctate 1 mm or smaller calcifications, near the corticomedullary junction of the frontal, insular, and occipital lobes (image 10, 13, 14) which are nonspecific.  There is no surrounding edema. These could represent sequelae of remote trauma or infection.  IMPRESSION:  No evidence for acute stroke or bleed.  At least three tiny calcifications in the right hemisphere, nonspecific.  No evidence for acute process or surrounding edema.  Critical Value/emergent results were called by telephone at the time of interpretation on 12/29/2011 at 3:49 p.m. to Dr. Thad Ranger, who verbally acknowledged these results.  Original Report Authenticated By: Elsie Stain, M.D.     No diagnosis found.    MDM  41 year old female patient with neurological exam as above which does not seem focal. Patient unwilling to participate with finger-nose heel shin but does not seem to have discoordinated movement. According to daughter patient was at home when about 45 minutes prior to arrival she says she started having chest pain and neck discomfort and said that  she started feeling weak. Here patient complaining that her left side is weak and numb. Patient has nonfocal findings on exam and has ability to move left upper extremity left lower extremity showed exam that she's not willing to do so by her own volition. Neurologist at bedside agrees with assessment that this is not an acute ischemic event. Normal CT. Neurology recommends CTA to evaluate for carotid dissection labs otherwise reassuring.   Date: 12/29/2011  Rate: 89  Rhythm: normal sinus rhythm  QRS Axis: normal  Intervals: normal  ST/T Wave abnormalities: normal  Conduction Disutrbances: none  Narrative Interpretation:   Old EKG Reviewed: No significant changes noted  Results for orders  placed during the hospital encounter of 12/29/11  PROTIME-INR      Component Value Range   Prothrombin Time 12.6  11.6 - 15.2 seconds   INR 0.92  0.00 - 1.49  APTT      Component Value Range   aPTT 31  24 - 37 seconds  CBC      Component Value Range   WBC 13.8 (*) 4.0 - 10.5 K/uL   RBC 4.48  3.87 - 5.11 MIL/uL   Hemoglobin 13.8  12.0 - 15.0 g/dL   HCT 16.1  09.6 - 04.5 %   MCV 91.7  78.0 - 100.0 fL   MCH 30.8  26.0 - 34.0 pg   MCHC 33.6  30.0 - 36.0 g/dL   RDW 40.9  81.1 - 91.4 %   Platelets 289  150 - 400 K/uL  DIFFERENTIAL      Component Value Range   Neutrophils Relative 64  43 - 77 %   Neutro Abs 8.8 (*) 1.7 - 7.7 K/uL   Lymphocytes Relative 30  12 - 46 %   Lymphs Abs 4.1 (*) 0.7 - 4.0 K/uL   Monocytes Relative 6  3 - 12 %   Monocytes Absolute 0.8  0.1 - 1.0 K/uL   Eosinophils Relative 1  0 - 5 %   Eosinophils Absolute 0.1  0.0 - 0.7 K/uL   Basophils Relative 0  0 - 1 %   Basophils Absolute 0.0  0.0 - 0.1 K/uL  COMPREHENSIVE METABOLIC PANEL      Component Value Range   Sodium 137  135 - 145 mEq/L   Potassium 3.4 (*) 3.5 - 5.1 mEq/L   Chloride 104  96 - 112 mEq/L   CO2 18 (*) 19 - 32 mEq/L   Glucose, Bld 85  70 - 99 mg/dL   BUN 11  6 - 23 mg/dL   Creatinine, Ser 7.82  0.50 - 1.10 mg/dL   Calcium 9.3  8.4 - 95.6 mg/dL   Total Protein 8.3  6.0 - 8.3 g/dL   Albumin 4.0  3.5 - 5.2 g/dL   AST 17  0 - 37 U/L   ALT 10  0 - 35 U/L   Alkaline Phosphatase 106  39 - 117 U/L   Total Bilirubin 0.3  0.3 - 1.2 mg/dL   GFR calc non Af Amer >90  >90 mL/min   GFR calc Af Amer >90  >90 mL/min  CK TOTAL AND CKMB      Component Value Range   Total CK 60  7 - 177 U/L   CK, MB 1.2  0.3 - 4.0 ng/mL   Relative Index RELATIVE INDEX IS INVALID  0.0 - 2.5  TROPONIN I      Component Value Range   Troponin I <0.30  <0.30 ng/mL  GLUCOSE, CAPILLARY      Component Value Range   Glucose-Capillary 71  70 - 99 mg/dL  URINALYSIS, ROUTINE W REFLEX MICROSCOPIC      Component Value Range    Color, Urine YELLOW  YELLOW   APPearance CLEAR  CLEAR   Specific Gravity, Urine 1.029  1.005 - 1.030   pH 6.5  5.0 - 8.0   Glucose, UA NEGATIVE  NEGATIVE mg/dL   Hgb urine dipstick NEGATIVE  NEGATIVE   Bilirubin Urine NEGATIVE  NEGATIVE   Ketones, ur NEGATIVE  NEGATIVE mg/dL   Protein, ur NEGATIVE  NEGATIVE mg/dL   Urobilinogen, UA 0.2  0.0 - 1.0 mg/dL   Nitrite NEGATIVE  NEGATIVE   Leukocytes, UA NEGATIVE  NEGATIVE  URINE RAPID DRUG SCREEN (HOSP PERFORMED)      Component Value Range   Opiates NONE DETECTED  NONE DETECTED   Cocaine NONE DETECTED  NONE DETECTED   Benzodiazepines NONE DETECTED  NONE DETECTED   Amphetamines NONE DETECTED  NONE DETECTED   Tetrahydrocannabinol NONE DETECTED  NONE DETECTED   Barbiturates NONE DETECTED  NONE DETECTED  POCT PREGNANCY, URINE      Component Value Range   Preg Test, Ur NEGATIVE  NEGATIVE   CT Angio Neck W/Cm &/Or Wo/Cm (Final result)   Result time:12/29/11 1721    Final result by Rad Results In Interface (12/29/11 17:21:36)    Narrative:   *RADIOLOGY REPORT*  Clinical Data: Acute onset of left-sided chest pain and shoulder pain extending into the left neck. Left-sided numbness and facial asymmetry.  CT ANGIOGRAPHY NECK  Technique: Multidetector CT imaging of the neck was performed using the standard protocol during bolus administration of intravenous contrast. Multiplanar CT image reconstructions including MIPs were obtained to evaluate the vascular anatomy. Carotid stenosis measurements (when applicable) are obtained utilizing NASCET criteria, using the distal internal carotid diameter as the denominator.  Contrast: 80mL OMNIPAQUE IOHEXOL 350 MG/ML SOLN  Comparison: CT head without contrast 12/29/2011.  Findings: A standard three-vessel arch configuration is present. The vertebral arteries both originate from the subclavian arteries without significant stenoses. The vertebral arteries are codominant in the neck. The vertebral  basilar junction is within normal limits. The visualized basilar artery is normal.  The right common carotid artery is within normal limits. The bifurcation is unremarkable. The cervical right internal carotid artery is normal.  The left common carotid artery is within normal limits. The bifurcation is unremarkable. The cervical left internal carotid artery is normal.  The soft tissues of the neck are within normal limits.  The bone windows are unremarkable.  Review of the MIP images confirms the above findings.  IMPRESSION: 1. Normal appearance of the cervical vasculature. 2. No acute or focal abnormality to explain the patients symptoms.  Original Report Authenticated By: Jamesetta Orleans. MATTERN, M.D.            CT Head Wo Contrast (Final result)   Result time:12/29/11 1600    Final result by Rad Results In Interface (12/29/11 16:00:45)    Narrative:   *RADIOLOGY REPORT*  Clinical Data: Sudden onset of chest pain with left arm weakness and left facial droop. Code stroke.  CT HEAD WITHOUT CONTRAST  Technique: Contiguous axial images were obtained from the base of the skull through the vertex without contrast  Comparison: None.  Findings: The brain has a normal appearance without evidence for hemorrhage, acute infarction, hydrocephalus, or mass lesion. There is no extra axial fluid collection. The skull and paranasal sinuses are normal.  Scattered throughout the right hemisphere are at least three punctate 1 mm or smaller calcifications, near the corticomedullary junction of the frontal, insular, and occipital lobes (image 10, 13, 14) which are nonspecific. There is no surrounding edema. These could represent sequelae of remote trauma or infection.  IMPRESSION:  No evidence for acute stroke or bleed.  At least three tiny calcifications in the right hemisphere, nonspecific. No evidence for acute process or surrounding edema.  Critical Value/emergent results  were called by telephone at the time of interpretation on 12/29/2011 at 3:49 p.m. to Dr. Thad Ranger, who verbally acknowledged these results.  Original Report Authenticated By: Elsie Stain, M.D.    Patient with resolution of symptoms here in the emergency department. I doubt TIA given the lack of past medical history and nonfocal neurological exam but by myself and a neurologist. Pain treated with Toradol with significant improvement. Normal CT study shows no carotid dissection. I suspect patient had cervical tension headache given how her pain started in her lower neck and radiated up. Patient's neurological symptoms not explained by stroke or bleed or carotid dissection and to relate her headache. Not related to any recent seizures infections or other emergent causes. Now patient back to baseline with normal workup will have her followup with PCP for reevaluation.  Case discussed with Sharlot Gowda, MD 12/29/11 [phone]

## 2011-12-29 NOTE — ED Notes (Signed)
Pt continues to c/o  Pain in left side of neck.  Daughter remains at bedside.

## 2011-12-30 ENCOUNTER — Encounter: Payer: Self-pay | Admitting: Internal Medicine

## 2012-01-02 NOTE — ED Provider Notes (Signed)
I saw and evaluated the patient, reviewed the resident's note and I agree with the findings and plan.  Pt with stroke like symptoms but exam with multiple inconsistencies not supportive of this. Pt examined by neurology and does not believe stroke either. CT including head and neck to eval for dissection negative. W/u reassuring. HA improved with meds. Low suspicion for bleed or infectious. Plan close outpt fu.    Raeford Razor, MD 01/02/12 [phone]

## 2012-05-23 ENCOUNTER — Encounter (HOSPITAL_COMMUNITY): Payer: Self-pay | Admitting: *Deleted

## 2012-05-23 ENCOUNTER — Emergency Department (HOSPITAL_COMMUNITY)
Admission: EM | Admit: 2012-05-23 | Discharge: 2012-05-24 | Disposition: A | Discharge status: Home / Self Care | Payer: Self-pay | Attending: Emergency Medicine | Admitting: Emergency Medicine

## 2012-05-23 DIAGNOSIS — W311XXA Contact with metalworking machines, initial encounter: Secondary | ICD-10-CM | POA: Insufficient documentation

## 2012-05-23 DIAGNOSIS — F3289 Other specified depressive episodes: Secondary | ICD-10-CM | POA: Insufficient documentation

## 2012-05-23 DIAGNOSIS — F17200 Nicotine dependence, unspecified, uncomplicated: Secondary | ICD-10-CM | POA: Insufficient documentation

## 2012-05-23 DIAGNOSIS — Y939 Activity, unspecified: Secondary | ICD-10-CM | POA: Insufficient documentation

## 2012-05-23 DIAGNOSIS — Z8742 Personal history of other diseases of the female genital tract: Secondary | ICD-10-CM | POA: Insufficient documentation

## 2012-05-23 DIAGNOSIS — G909 Disorder of the autonomic nervous system, unspecified: Secondary | ICD-10-CM | POA: Insufficient documentation

## 2012-05-23 DIAGNOSIS — S61209A Unspecified open wound of unspecified finger without damage to nail, initial encounter: Secondary | ICD-10-CM | POA: Insufficient documentation

## 2012-05-23 DIAGNOSIS — Z23 Encounter for immunization: Secondary | ICD-10-CM | POA: Insufficient documentation

## 2012-05-23 DIAGNOSIS — F329 Major depressive disorder, single episode, unspecified: Secondary | ICD-10-CM | POA: Insufficient documentation

## 2012-05-23 DIAGNOSIS — IMO0002 Reserved for concepts with insufficient information to code with codable children: Secondary | ICD-10-CM

## 2012-05-23 DIAGNOSIS — Z862 Personal history of diseases of the blood and blood-forming organs and certain disorders involving the immune mechanism: Secondary | ICD-10-CM | POA: Insufficient documentation

## 2012-05-23 DIAGNOSIS — Y929 Unspecified place or not applicable: Secondary | ICD-10-CM | POA: Insufficient documentation

## 2012-05-23 DIAGNOSIS — Z79899 Other long term (current) drug therapy: Secondary | ICD-10-CM | POA: Insufficient documentation

## 2012-05-23 NOTE — ED Provider Notes (Signed)
History     CSN: [id number]  Arrival date & time 05/23/12  2332   First MD Initiated Contact with Patient 05/23/12 2337      No chief complaint on file.   (Consider location/radiation/quality/duration/timing/severity/associated sxs/prior treatment) HPI  Lori Murray is a 41 y.o. female complaining of laceration to left 2nd digit distal phalanx in the radial side. Pt cut herself on a non-running chainsaw. Last tetanus was in 01/2012. Bleeding is well controlled, denies decrease in ROM or numbness/parasthesia.   Past Medical History  Diagnosis Date  . History of tension headache   . History of tobacco abuse   . Gardnerella vaginalis infection     recurrent  . Metrorrhagia     h/o treated with OCP.  Marland Kitchen Depression   . Anemia     iron deficiency  . Fibroids     s/p TAH- right salpingo- oophorectomy in 2010  . 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 followed at [facility] center; LUL ptosis 2/2 Horner's; f/u with Dr. Corinne Ports    Past Surgical History  Procedure Date  . Benign schwannoma  06/20/09    excision from left neck, by Dr. Reine [facility])  . Tumor removal   . Oophorectomy     L    Family History  Problem Relation Age of Onset  . Diabetes Neg Hx   . Hypertension Neg Hx   . Depression Neg Hx     History  Substance Use Topics  . Smoking status: Current Some Day Smoker  . Smokeless tobacco: Not on file  . Alcohol Use: No    OB History    Grav Para Term Preterm Abortions TAB SAB Ect Mult Living                  Review of Systems  Constitutional: Negative for fever.  Respiratory: Negative for shortness of breath.   Cardiovascular: Negative for chest pain.  Gastrointestinal: Negative for nausea, vomiting, abdominal pain and diarrhea.  Skin: Positive for wound.  All other systems reviewed and are negative.    Allergies  Review of patient's allergies indicates no known allergies.  Home Medications   Current  Outpatient Rx  Name  Route  Sig  Dispense  Refill  . BIOTENE MOISTURIZING MOUTH MT SOLN   Mouth/Throat   Use as directed in the mouth or throat.           Marland Kitchen LORATADINE-PSEUDOEPHEDRINE ER 5-120 MG PO TB12   Oral   Take 1 tablet by mouth 2 (two) times daily.   60 tablet   2   . POLYETHYL GLYCOL-PROPYL GLYCOL 0.4-0.3 % OP SOLN   Ophthalmic   Apply to eye.           . VENLAFAXINE HCL ER 75 MG PO CP24   Oral   Take 1 capsule (75 mg total) by mouth daily.   30 capsule   2   . VENLAFAXINE HCL ER 75 MG PO CP24   Oral   Take 75 mg by mouth daily.           BP 120/90  Pulse 84  Temp 97.7 F (36.5 C) (Oral)  Resp 18  SpO2 100%  LMP 07/28/2008  Physical Exam  Nursing note and vitals reviewed. Constitutional: She is oriented to person, place, and time. She appears well-developed and well-nourished. No distress.  HENT:  Head: Normocephalic.  Eyes: Conjunctivae normal and EOM are normal.  Cardiovascular: Normal  rate.   Pulmonary/Chest: Effort normal. No stridor.  Abdominal: Soft.  Musculoskeletal: Normal range of motion.  Neurological: She is alert and oriented to person, place, and time.  Skin:       7 x 10 mm jagged laceration to left 2nd distal phalanx on the radial side.   Pt has FROM in both flexion and extension of DIP  Distal sensation intact  Psychiatric: She has a normal mood and affect.    ED Course  Procedures (including critical care time)  LACERATION REPAIR Performed by: Wynetta Emery Authorized by: Wynetta Emery Consent: Verbal consent obtained. Risks and benefits: risks, benefits and alternatives were discussed Consent given by: patient Patient identity confirmed: Wrist band  Prepped and Draped in normal sterile fashion  Tetanus: up to date  Laceration Location: left 2nd distal phalanx on the radial side.   Laceration Length: 7 x10 mm  Anesthesia: Digital block  Local anesthetic: 2% without epinephrine  Anesthetic total: 3  ml  Irrigation method: syringe  Amount of cleaning: copious   Wound explored to depth in good light on a bloodless field with no foreign bodies seen or palpated.   Skin closure: 5-0 Polypropylene  Number of sutures: 7  Technique: Simple interupted  Patient tolerance: Patient tolerated the procedure well with no immediate complications.  Antibx ointment applied. Instructions for care discussed verbally and patient provided with additional written instructions for homecare and f/u.  Labs Reviewed - No data to display No results found.   1. Laceration       MDM   Patient with laceration to left second digit. Wound closed and advised patient to follow with hand surgeon Dr. Mina Marble as I am not sure that the flap is viable.  X-ray to evaluate fracture or foreign body wet read performed by me shows no fracture or foreign body. There is a delay in radiology over reads tonight.  Patient will be discharged prophylactic antibiotics.  New Prescriptions   CEPHALEXIN (KEFLEX) 500 MG CAPSULE    Take 1 capsule (500 mg total) by mouth 2 (two) times daily.   OXYCODONE-ACETAMINOPHEN (PERCOCET/ROXICET) 5-325 MG PER TABLET    1 to 2 tabs PO q6hrs  PRN for pain          Wynetta Emery, PA-C 05/24/12 0149

## 2012-05-23 NOTE — ED Notes (Signed)
Per family report: pt cut her left index finger on a turned off chain saw.  Bleeding controlled.

## 2012-05-24 ENCOUNTER — Emergency Department (HOSPITAL_COMMUNITY): Payer: Self-pay

## 2012-05-24 MED ORDER — OXYCODONE-ACETAMINOPHEN 5-325 MG PO TABS: ORAL_TABLET | ORAL | Status: DC

## 2012-05-24 MED ORDER — OXYCODONE-ACETAMINOPHEN 5-325 MG PO TABS
2.0000 | ORAL_TABLET | Freq: Once | ORAL | Status: AC
  Administered 2012-05-24: 2 via ORAL
  Filled 2012-05-24: qty 2

## 2012-05-24 MED ORDER — CEPHALEXIN 500 MG PO CAPS: 500.0000 mg | ORAL_CAPSULE | Freq: Two times a day (BID) | ORAL | Status: DC

## 2012-05-24 MED ORDER — TETANUS-DIPHTH-ACELL PERTUSSIS 5-2.5-18.5 LF-MCG/0.5 IM SUSP
0.5000 mL | Freq: Once | INTRAMUSCULAR | Status: AC
  Administered 2012-05-24: 0.5 mL via INTRAMUSCULAR
  Filled 2012-05-24: qty 0.5

## 2012-05-24 NOTE — ED Provider Notes (Signed)
Medical screening examination/treatment/procedure(s) were performed by non-physician practitioner and as supervising physician I was immediately available for consultation/collaboration.  Sunnie Nielsen, MD 05/24/12 [phone]

## 2012-06-21 ENCOUNTER — Ambulatory Visit (INDEPENDENT_AMBULATORY_CARE_PROVIDER_SITE_OTHER): Payer: Self-pay | Admitting: Internal Medicine

## 2012-06-21 ENCOUNTER — Encounter: Payer: Self-pay | Admitting: Internal Medicine

## 2012-06-21 VITALS — BP 120/80 | HR 75 | Temp 97.1°F | Ht 63.0 in | Wt 139.6 lb

## 2012-06-21 DIAGNOSIS — F329 Major depressive disorder, single episode, unspecified: Secondary | ICD-10-CM

## 2012-06-21 DIAGNOSIS — F32A Depression, unspecified: Secondary | ICD-10-CM

## 2012-06-21 DIAGNOSIS — N898 Other specified noninflammatory disorders of vagina: Secondary | ICD-10-CM

## 2012-06-21 DIAGNOSIS — Z0000 Encounter for general adult medical examination without abnormal findings: Secondary | ICD-10-CM

## 2012-06-21 DIAGNOSIS — R300 Dysuria: Secondary | ICD-10-CM

## 2012-06-21 DIAGNOSIS — Z8659 Personal history of other mental and behavioral disorders: Secondary | ICD-10-CM

## 2012-06-21 DIAGNOSIS — Z23 Encounter for immunization: Secondary | ICD-10-CM

## 2012-06-21 LAB — POCT URINALYSIS DIPSTICK
Glucose, UA: NEGATIVE
Nitrite, UA: NEGATIVE
Protein, UA: NEGATIVE
Spec Grav, UA: 1.015
Urobilinogen, UA: 0.2

## 2012-06-21 MED ORDER — VENLAFAXINE HCL ER 75 MG PO CP24: 75.0000 mg | ORAL_CAPSULE | Freq: Every day | ORAL | Status: DC

## 2012-06-21 NOTE — Assessment & Plan Note (Signed)
Stable. Denies any suicidal ideations or thoughts. Her venlafaxine prescription was refilled.

## 2012-06-21 NOTE — Assessment & Plan Note (Signed)
She got a flu shot today. She reports having a mammogram done in a discussed 2013 at [place] and reports it to be normal. The patient was requested if she can get [place] report to complete documentation.

## 2012-06-21 NOTE — Patient Instructions (Addendum)
General Instructions: Please schedule a follow up appointment in 3 months or sooner if needed . Please bring your medication bottles with your next appointment. Please take your medicines as prescribed. I will call you with your lab results if anything will be abnormal.    Treatment Goals:  Goals (1 Years of Data) as of 06/21/2012    None      Progress Toward Treatment Goals:  Treatment Goal 06/21/2012  Stop smoking stopped smoking    Self Care Goals & Plans:  Self Care Goal 06/21/2012  Manage my medications bring my medications to every visit; take my medicines as prescribed; refill my medications on time  Eat healthy foods drink diet soda or water instead of juice or soda; eat more vegetables; eat foods that are low in salt  Be physically active find an activity I enjoy; find workout friends       Care Management & Community Referrals:  Referral 06/21/2012  Referrals made for care management support none needed

## 2012-06-21 NOTE — Progress Notes (Signed)
Subjective:   Patient ID: Lori Murray female   DOB: [birth year] 42 y.o.   MRN: [id number]  HPI: 42 year old woman with past medical history significant for depression, status post hysterectomy presents to the clinic for vaginal discomfort x 4 weeks  Dysuria: Patient reports having vaginal discomfort for last 4 weeks. She describes it as on and off dysuria, suprapubic abdominal pain and urgency. Reports noticing some blood in her urine about a week ago. She also reports having nausea and vomiting about 2 weeks ago but has not resolved .Denies any frequency. She also reports having yellowish vaginal discharge for last 2 weeks associated with some itching. She tried to use over-the-counter monilial cream but it didn't help her much.  She also reports having URI symptoms including cough, rhinorrhea and nasal congestion about 2 weeks ago which has resolved now.  She was seen in the ER  On 05/23/12 for laceration to left 2nd digit distal phalanx in the radial side, when she cut herself on a non-running chainsaw. She was prescribed keflex for 7 days - has completed the course. Lesion is healing well.  Depression: She has not been taking venlafaxine because apparently been her pharmacy called for refill,  she did not get her prescription. Is requesting refill of that. Preventative care: States that she had a mammogram done in August of 2013 at [place] and was normal.  Past Medical History  Diagnosis Date  . History of tension headache   . History of tobacco abuse   . Gardnerella vaginalis infection     recurrent  . Metrorrhagia     h/o treated with OCP.  Marland Kitchen Depression   . Anemia     iron deficiency  . Fibroids     s/p TAH- right salpingo- oophorectomy in 2010  . 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 followed at [facility]; LUL ptosis 2/2 Horner's; f/u with Dr. Corinne Ports   Family History  Problem Relation Age of Onset  . Diabetes Neg Hx   . Hypertension Neg  Hx   . Depression Neg Hx    History   Social History  . Marital Status: Legally Separated    Spouse Name: N/A    Number of Children: N/A  . Years of Education: N/A   Occupational History  . Not on file.   Social History Main Topics  . Smoking status: Current Some Day Smoker  . Smokeless tobacco: Not on file  . Alcohol Use: No  . Drug Use: No  . Sexually Active:    Other Topics Concern  . Not on file   Social History Narrative   ** Merged History Encounter ** Regular exercise- yes.   Review of Systems: General: Denies , chills, diaphoresis, appetite change and fatigue, + fever. HEENT: Denies photophobia, eye pain, redness, hearing loss, ear pain, congestion, sore throat, rhinorrhea, sneezing, mouth sores, trouble swallowing, neck pain, neck stiffness and tinnitus. Respiratory: Denies SOB, DOE, cough, chest tightness, and wheezing. Cardiovascular: Denies to chest pain, palpitations and leg swelling. Gastrointestinal: Denies x, diarrhea, constipation, blood in stool and abdominal distention, + abdominal pain,  + nausea, + vomiting( about 2 weeks ago but has resolved now) . Genitourinary: Denies frequency, hematuria, flank pain and difficulty urinating,  + vaginal itching, + dysuria, + urgency. Musculoskeletal: Denies myalgias, back pain, joint swelling, arthralgias and gait problem.  Skin: Denies pallor, rash and wound. Neurological: Denies dizziness, seizures, syncope, weakness, light-headedness, numbness and headaches. Hematological: Denies adenopathy, easy  bruising, personal or family bleeding history. Psychiatric/Behavioral: Denies suicidal ideation, mood changes, confusion, nervousness, sleep disturbance and agitation.    Current Outpatient Medications: Current Outpatient Prescriptions  Medication Sig Dispense Refill  . Artificial Saliva (BIOTENE MOISTURIZING MOUTH) SOLN Use as directed in the mouth or throat.        . cephALEXin (KEFLEX) 500 MG capsule Take 1 capsule  (500 mg total) by mouth 2 (two) times daily.  14 capsule  0  . loratadine-pseudoephedrine (CLARITIN-D 12 HOUR) 5-120 MG per tablet Take 1 tablet by mouth 2 (two) times daily.  60 tablet  2  . oxyCODONE-acetaminophen (PERCOCET/ROXICET) 5-325 MG per tablet 1 to 2 tabs PO q6hrs  PRN for pain  10 tablet  0  . Polyethyl Glycol-Propyl Glycol (SYSTANE ULTRA) 0.4-0.3 % SOLN Apply to eye.        . venlafaxine XR (EFFEXOR XR) 75 MG 24 hr capsule Take 1 capsule (75 mg total) by mouth daily.  30 capsule  2  . venlafaxine XR (EFFEXOR-XR) 75 MG 24 hr capsule Take 75 mg by mouth daily.        Allergies: No Known Allergies    Objective:   Physical Exam: Filed Vitals:   06/21/12 1331  BP: 120/80  Pulse: 75  Temp: 97.1 F (36.2 C)    General: Vital signs reviewed and noted. Well-developed, well-nourished, in no acute distress; alert, appropriate and cooperative throughout examination. Head: Normocephalic, atraumatic Lungs: Normal respiratory effort. Clear to auscultation BL without crackles or wheezes. Heart: RRR. S1 and S2 normal without gallop, murmur, or rubs. Abdomen:BS normoactive. Soft, Nondistended, non-tender.  No masses or organomegaly. Extremities: No pretibial edema. Genitourinary: No vaginal masses or polyps felt. No discharge was noted.       Assessment & Plan:

## 2012-06-21 NOTE — Assessment & Plan Note (Signed)
Patient reports having vaginal discomfort - she describes as having on and off dysuria, urgency and suprapubic pain for last  4 weeks. She was also nauseous and threw up about 2 weeks ago. She also noted yellowish vaginal discharge for last week. On genitourinary exam no vaginal discharge was noted. Urine dipstick was obtained but it was negative for nitrites and leukocytes and showed trace amount of blood. -Obtain UA with reflex microscopy to workup trace amount of blood in her urine. If positive would add cultures. -Would also obtain wet prep, GC/Chlamydia.

## 2012-06-22 LAB — URINALYSIS, ROUTINE W REFLEX MICROSCOPIC
Hgb urine dipstick: NEGATIVE
Ketones, ur: NEGATIVE mg/dL
Leukocytes, UA: NEGATIVE
Nitrite: NEGATIVE
Specific Gravity, Urine: <1.005 — ABNORMAL LOW (ref 1.005–1.030)
Urobilinogen, UA: 0.2 mg/dL (ref 0.0–1.0)
pH: 6 (ref 5.0–8.0)

## 2012-09-20 ENCOUNTER — Ambulatory Visit: Payer: Self-pay | Admitting: Internal Medicine

## 2012-09-22 ENCOUNTER — Encounter: Payer: Self-pay | Admitting: Internal Medicine

## 2012-09-22 ENCOUNTER — Ambulatory Visit (INDEPENDENT_AMBULATORY_CARE_PROVIDER_SITE_OTHER): Payer: Self-pay | Admitting: Internal Medicine

## 2012-09-22 VITALS — BP 134/86 | HR 62 | Temp 97.1°F | Ht 63.0 in | Wt 141.0 lb

## 2012-09-22 DIAGNOSIS — F329 Major depressive disorder, single episode, unspecified: Secondary | ICD-10-CM

## 2012-09-22 DIAGNOSIS — G4700 Insomnia, unspecified: Secondary | ICD-10-CM

## 2012-09-22 DIAGNOSIS — R61 Generalized hyperhidrosis: Secondary | ICD-10-CM

## 2012-09-22 DIAGNOSIS — F32A Depression, unspecified: Secondary | ICD-10-CM

## 2012-09-22 MED ORDER — ESTROGENS CONJUGATED 0.625 MG PO TABS: 0.6250 mg | ORAL_TABLET | Freq: Every day | ORAL | Status: DC

## 2012-09-22 MED ORDER — VENLAFAXINE HCL ER 75 MG PO CP24: 75.0000 mg | ORAL_CAPSULE | Freq: Every day | ORAL | Status: DC

## 2012-09-22 NOTE — Assessment & Plan Note (Signed)
Pertinent Data:  Ref. Range 07/30/2011  LH Post Menopausal 15.9 - 54.0 mIU/mL 3.6  FSH Post Menopausal 23.0 - 116.3 mIU/mL 4.1  Prolactin Post Menopausal 1.8 - 20.3 ng/mL 6.6  HIV Ab Non reactive Nonreactive   Assessment: The cause of this patient's night sweats are not clear, as noted per the last assessment and plan for this issue, the patient is postmenopausal with last menstrual cycle approximately 4 years ago. She has had right salpingo-oophorectomy. Although would be atypical, ovarian failure may account for the patient's symptoms. No other clear etiology is evident, however differential diagnoses include endocrine disorders, medication side effect (although no clear new medication to precipitate), amongst others.  Plan:      Check TSH, FSH, LH again.  Will start premarin 0.625 mg once daily - risks/ benefits handout provided.

## 2012-09-22 NOTE — Assessment & Plan Note (Signed)
Assessment: Patient's insomnia may be related to frequent nightly awakenings from her night sweats. Otherwise, is difficult to certain her sleep hygiene because it was difficult for her to explain to me through the translator what she is doing just prior to her falling asleep. Given that we are already treating the night sweats, out like to continue to monitor see if there is concurrent improvement of this issue once those are stabilized. I also will provide handout regarding sleep hygiene for the patient.  Plan:      Continue to monitor.  Check TSH.  Handout given in spanish regarding sleep hygiene.

## 2012-09-22 NOTE — Patient Instructions (Addendum)
General Instructions:  Please follow-up at the clinic in 1 month, at which time we will reevaluate your night sweats - OR, please follow-up in the clinic sooner if needed.  There have been changes in your medications:  START Premarin once a day for your hot flashes/ night sweats    If you have been started on new medication(s), and you develop symptoms concerning for allergic reaction, including, but not limited to, throat closing, tongue swelling, rash, please stop the medication immediately and call the clinic at [phone], and go to the ER.  If you are diabetic, please bring your meter to your next visit.  If symptoms worsen, or new symptoms arise, please call the clinic or go to the ER.  See tips below for sleep hygiene education.  PLEASE BRING ALL OF YOUR MEDICATIONS  IN A BAG TO YOUR NEXT APPOINTMENT   Treatment Goals:  Goals (1 Years of Data) as of 09/22/12   None      Progress Toward Treatment Goals:  Treatment Goal 06/21/2012  Stop smoking stopped smoking    Self Care Goals & Plans:  Self Care Goal 09/22/2012  Manage my medications take my medicines as prescribed; bring my medications to every visit; refill my medications on time  Eat healthy foods eat foods that are low in salt; eat more vegetables  Be physically active take a walk every day  Stop smoking call [facility] (1-800-QUIT-NOW)       Care Management & Community Referrals:  Referral 06/21/2012  Referrals made for care management support none needed      Insomnio (Insomnia) El insomnio es un trastorno frecuente en la capacidad para dormirse o para Personal assistant dormido. Puede ser un problema crnico o un trastorno del momento. En ambos casos es un problema frecuente. Puede tratarse de un problema del momento cuando se relaciona con alguna situacin de estrs o preocupacin. Es un trastorno crnico cuando se relaciona con situaciones de Armed forces operational officer las horas de vigilia o con malos hbitos de sueo.  Con el tiempo, la privacin del sueo en s misma, puede hacer que el problema empeore. Las cosas ms pequeas se agravan debido al cansancio y a que la capacidad para enfrentarlas disminuye. CAUSAS  Estrs, ansiedad y depresin.  Malos hbitos para dormir.  Distracciones como mirar TV en la cama.  Siestas en horarios prximos a la hora de ir a dormir.  Involucrarse en conversaciones de gran carga emocional antes de ir a dormir.  Leer textos tcnicos antes de dormir.  Consumir alcohol y otros sedantes. Ellos pueden empeorar el problema. Pueden modificar los patrones de sueo normales y la normal [place].  Consumir estimulantes como cafena algunas horas antes de ir a dormir.  Sndromes dolorosos y las dificultades respiratorias pueden causar insomnio.  Realizar ejercicios a ltima hora de la noche.  El cambio en las zonas horarias puede causar trastornos del sueo (jet lag). En algunos casos se recomienda que otra persona observe sus patrones de sueo. Deben observar los perodos en los que no respira durante la noche (apnea del sueo). Tambin deben observar cunto tiempo duran esos perodos. Si vive slo o no tiene una persona de confianza que lo observe, podr concurrir a una clnica del sueo, en la que lo controlarn de [place] profesional. La apnea del sueo requiere controles y TEFL teacher. Entregue su historia clnica al profesional que lo asiste Tambin infrmele las observaciones que sus familiares hayan hecho con respecto a sus hbitos de sueo.  SNTOMAS  Sentir por  la maana que no ha descansado lo suficiente.  Ansiedad y agitacin a la hora de dormir  Dificultad para dormirse o para Arts administrator sueo. TRATAMIENTO  El [employer] indicar un tratamiento para los trastornos subyacentes. Tambin podr aconsejarlo o ayudarlo si usted toma alcohol o se automedica con otras drogas. El tratamiento de los problemas subyacentes generalmente eliminarn los problemas  de insomnio.  Podrn prescribirle medicamentos para usar durante un plazo breve. Generalmente no se recomiendan para un uso prolongado.  Generalmente no se recomiendan los medicamentos de venta libre para un uso prolongado. Podran causarle adiccin.  Puede hacer ms fcil el conciliar el sueo si realiza modificaciones en su estilo de vida tales como:  Utilizar tcnicas de relajacin que favorecen la respiracin y reducen la tensin muscular.  No practicar actividad fsica en las ltimas horas del da.  Modificar la dieta y el horario de la ltima comida. No tomar colaciones durante la noche  Trate de establecer una hora habitual para irse a dormir.  La psicoterapia puede ser de utilidad para los problemas que le ocasionan estrs y preocupaciones.  Si hay un ambiente ruidoso y no Advertising account executive, puede ser til Optometrist suave o sonidos agradables.  Suspenda los trabajos tediosos y [employer] al menos una hora antes de ir a dormir. INSTRUCCIONES PARA EL CUIDADO DOMICILIARIO  Lleve un diario. Infrmele al profesional que lo asiste sus progresos. Esto incluye todos los efectos secundarios de los medicamentos. Concurra regularmente a la Psychiatrist con el profesional [place] nota de:  La hora en que se duerme.  Las horas en las que permanece despierto durante la noche.  La calidad del sueo.  Cmo se siente al da siguiente. Esta informacin ayudar a que Biochemist, clinical.   Levntese de la cama si permanece despierto por ms de 15 minutos. Lea o realice alguna actividad tranquila. Mantenga las luces bajas. Espere hasta que sienta sueo y luego vuelva a la cama.  Mantenga un ritmo constante de vigilia y sueo. Evite las siestas.  Practique actividad fsica con regularidad.  Evite las distracciones en el momento de ir a dormir. Entre las distracciones se Production assistant, radio ver televisin o Education officer, environmental alguna actividad intensa o [place], hacer las cuentas de los gastos  domsticos.  Programe un ritual para irse a dormir. Mantenga una rutina familiar relacionada con el bao diario, el cepillado de los [place], irse a la cama todas las noches a la misma hora, Tax adviser [place]. La rutina aumenta el xito de conciliar el sueo ms rpido.  Use tcnicas de relajacin. Practique rutinas que favorezcan la respiracin y alivien la tensin muscular. Tambin puede ayudarlo la visualizacin de escenas pacficas. Tambin puede tratar de [employer] pensamientos problemticos o molestos si mantiene la mente ocupada con pensamientos repetitivos o aburridos, como el antiguo consejo de Multimedia programmer. Tambin puede ser ms creativo, e imaginar que planta hermosas flores en su jardn, una detrs de la otra.  Durante el da, trabaje para Chief Strategy Officer. Cuando no es posible, algunas de las sugerencias ya presentadas lo ayudarn a reducir la ansiedad que acompaa las situaciones de estrs. EST SEGURO QUE:   Comprende las instrucciones para el alta mdica.  Controlar su enfermedad.  Solicitar atencin mdica de inmediato segn las indicaciones. Document Released: 05/26/2005 Document Revised: 08/18/2011 [facility] Patient Information 2013 [place], [place].     Conjugated Estrogens tablets Qu es este medicamento? Los ESTROGENOS CONJUGADOS son un estrgeno. Se utiliza como terapia de reemplazo hormonal en mujeres que estn  experimentando la menopausia. Ayuda a tratar los sofocos y a prevenir la osteoporosis. Se utiliza tambin para tratar mujeres con bajos niveles de estrgeno o aquellas que le han extirpado sus ovarios. Este medicamento puede ser utilizado para otros usos; si tiene alguna pregunta consulte con su proveedor de atencin mdica o con su farmacutico. Qu le debo informar a mi profesional de la salud antes de tomar este medicamento? Necesita saber si usted presenta alguno de los siguientes problemas o situaciones: -sangrado vaginal anormal -enfermedad  vascular o cogulos sanguneos -cncer de mama, cervical, endometrio, ovario, hgado o uterino -demencia -diabetes -endometriosis -fibroides -enfermedad de la vescula biliar -enfermedad cardiaca o ataque cardiaco reciente -alta presin sangunea -niveles elevados de colesterol -nivel elevado de calcio en la sangre -enfermedad renal -enfermedad heptica -depresin mental -migraas -deficiencia de protena C -deficiencia de protena S -derrame cerebral -fuma tabaco -una reaccin alrgica o inusual a los estrgenos, otros medicamentos, alimentos, colorantes o conservantes -si est embarazada o buscando quedar embarazada -si est amamantando a un beb Cmo debo utilizar este medicamento? Tome este medicamento por va oral con un vaso de agua. Siga las instrucciones de la etiqueta del [place]. Tome sus dosis a intervalos regulares a la [employer]. No tome su medicamento con una frecuencia mayor a la indicada. Usted recibir un prospecto para el paciente para este producto con cada receta y relleno. Asegrese de leer este folleto cada vez cuidadosamente. Este folleto puede cambiar con frecuencia. Hable con su pediatra para informarse acerca del uso de este medicamento en nios. Este medicamento no est aprobado para uso en nios. Sobredosis: Pngase en contacto inmediatamente con un centro toxicolgico o una sala de urgencia si usted cree que haya tomado demasiado medicamento. ATENCIN: [employer] es solo para usted. No comparta este medicamento con nadie. Qu sucede si me olvido de una dosis? Si olvida una dosis, tmela lo antes posible. Si es casi la hora de la prxima dosis, tome slo esa dosis. No tome dosis adicionales o dobles. Qu puede interactuar con este medicamento? No tome esta medicina con ninguno de los siguientes medicamentos: -inhibidores de Chiropodist, tales como aminoglutetimida, anastrozol, exemestno, letrozol,  testolactona -metirapona Esta medicina tambin puede interactuar con los siguientes medicamentos: -barbitricos, como fenobarbital -carbamazepina -claritromicina -eritromicina -jugo de toronja -medicamentos para infecciones micticas, tales como itraconazol y quetoconazol -fenitona -rifampicina -ritonavir -hierba de [place] -hormonas tiroideas Puede ser que esta lista no menciona todas las posibles interacciones. Informe a su profesional de [employer] de [employer] productos a base de hierbas, medicamentos de [place] o suplementos nutritivos que est tomando. Si usted fuma, consume bebidas alcohlicas o si utiliza drogas ilegales, indqueselo tambin a su profesional de [employer]. Algunas sustancias pueden interactuar con su medicamento. A qu debo estar atento al usar PPL Corporation? Visite a su profesional de la salud para Insurance underwriter. Debe hacerse exmenes de Papanicolaou y exmenes de las mamas y la pelvis en forma regular mientras est tomando este medicamento. Tambin debe discutir con su profesional de la salud la necesidad de Product manager peridicamente y seguir las pautas que este profesional establezca para estas pruebas. Este medicamento puede hacer que su cuerpo retenga lquido, lo que puede provocar que se le hinchen los dedos, manos o tobillos. Su presin sangunea Manufacturing engineer. Comunquese con su mdico o con su profesional de la salud si siente que est reteniendo lquido. Si tiene algn motivo para pensar que est embarazada, deje de tomar este medicamento de inmediato  y comunquese con su mdico o con su profesional de Radiographer, therapeutic. El fumar tabaco mientras toma este medicamento aumenta el riesgo de formacin de cogulos o de sufrir un derrame cerebral, especialmente si tiene ms de 35 aos de edad. Se le recomienda enfticamente que no fume. Si [place] lentes de contacto y observa cambios en la visin, o si los lentes comienzan a resultarle  incmodos, consulte con su mdico de los ojos. El recubrimiento de [address] de este medicamento no se disuelve. El recubrimiento puede aparecer intacto en las heces. Esto no debe ser motivo de preocupacin. Si ve algo que se parece a una tableta [employer], consulte a su proveedor de Psychologist, prison and probation services. Este medicamento incrementa el riesgo de desarrollar una enfermedad (hiperplasia del endometrio) que puede derivar en cncer del revestimiento del tero. Al tomar otra hormona llamada progestina juntamente con este medicamento, se disminuye el riesgo de desarrollar esta enfermedad. Por lo tanto, si no le han extirpado el tero (por medio de una histerectoma), su mdico puede recetarle progestina para acompaar el tratamiento con estrgeno. Sin embargo, debe saber que puede correr [employer] adicionales para la salud al tomar estrgenos con progestina. Debe consultar sobre el uso de estrgenos y progestinas a su profesional de la salud a fin de Administrator, Civil Service beneficios y Herbalist que puede [place]. Si va a someterse a una operacin, tal vez deba dejar de tomar este medicamento. Consulte con su profesional de la salud antes de Nurse, learning disability operacin. Qu efectos secundarios puedo tener al [employer] este medicamento? Efectos secundarios que debe informar a su mdico o a Producer, television/film/video de la salud tan pronto como sea posible: -Therapist, art como erupcin cutnea, picazn o urticarias, hinchazn de la cara, labios o lengua -secreciones o cambios en el tejido de las mamas -cambios en la visin -dolor en el pecho -confusin, dificultad para hablar o entender -orina de color amarillo oscuro -sensacin general de estar enfermo o sntomas gripales -heces claras -nuseas o vmito -dolor, hinchazn, sensacin clida en las piernas -dolor en la parte superior del abdomen -dolores de cabeza severos -falta de aliento repentina -debilidad o entumecimiento repentino de la cara, brazos o piernas -problemas  para caminar, mareos, prdida de la coordinacin o equilibrio -sangrado vaginal inusual -color amarillento de los ojos o la piel Efectos secundarios que, por lo general, no requieren Psychologist, prison and probation services (debe informarlos a su mdico o a Producer, television/film/video de la salud si persisten o si son molestos): -cada del cabello -aumento de apetito o sed -aumento de descargas de orina -sntomas de una infeccin vaginal, como picazn, irritacin o flujo inusual -cansancio o debilidad inusual Puede ser que esta lista no menciona todos los posibles efectos secundarios. Comunquese a su mdico por asesoramiento mdico [employer]. Usted puede informar los efectos secundarios a la FDA por telfono al 1-800-FDA-1088. Dnde debo guardar mi medicina? Mantngala fuera del alcance de los nios. Gurdela a [employer], entre 15 y 30 grados C (77 y 40 grados F). Deseche todo el medicamento que no haya utilizado, despus de la fecha de vencimiento. ATENCIN: Este folleto es un resumen. Puede ser que no cubra toda la posible informacin. Si usted tiene preguntas acerca de esta medicina, consulte con su mdico, su farmacutico o su profesional de Radiographer, therapeutic.  2013, Elsevier/Gold Standard. (10/03/2010 5:43:36 PM)

## 2012-09-22 NOTE — Progress Notes (Signed)
Patient: Lori Murray   MRN: [id number]  DOB: 05/31/1971  PCP: Elyse Jarvis, MD   Subjective:    HPI: Ms. Lori Murray is a 42 y.o. female with a PMHx as outlined below, who presented to clinic today with a Spanish interpretor for the following:  1) Night sweats - patient indicates she is having nightly sweats for the last 15 days, which cause her to drench her close completely. She has otherwise been eating and drinking well, does not experience any fevers, chills, cough, congestion, sore throat, recent unintentional weight loss. She is sexually active, however with a monogamous partner, has no known history of STI's or STDs. She has last travel to [place] in September 2013, and denies any known TB contacts. She was seen for similar episodes in 07/2011 - at which time, the cause is also unclear. At that time, [facility], LH were normal, although low for postmenopausal state. Prolactin, HIV antibodies, and chest x-ray were within normal limits. Of note, the patient is status post hysterectomy and right salpingo-oopherectomy.  2) Insomnia -patient indicates that she has nightly difficulty with staying asleep. States that she will fall asleep and awake in within one to 2 hours, she will read in between to try to fall back asleep, however has difficulty. She cannot identify specific precipitants for her to awaken, however does feel that they're likely related to her experiencing the night sweats.  does not snore.    Review of Systems:. Per HPI.   Current Outpatient Prescriptions Medication Sig  . Artificial Saliva (BIOTENE MOISTURIZING MOUTH) SOLN Use as directed in the mouth or throat.    . loratadine-pseudoephedrine (CLARITIN-D 12 HOUR) 5-120 MG per tablet Take 1 tablet by mouth 2 (two) times daily.  Bertram Gala Glycol-Propyl Glycol (SYSTANE ULTRA) 0.4-0.3 % SOLN Apply to eye.    . venlafaxine XR (EFFEXOR XR) 75 MG 24 hr capsule Take 1 capsule (75 mg total) by mouth daily.    Allergies: No Known Allergies  Past Medical History  Diagnosis Date  . History of tension headache   . History of tobacco abuse   . Gardnerella vaginalis infection     recurrent  . Metrorrhagia     h/o treated with OCP.  Marland Kitchen Depression   . Anemia     iron deficiency  . Fibroids     s/p TAH- right salpingo- oophorectomy in 2010  . 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 followed at [facility] center; LUL ptosis 2/2 Horner's; f/u with Dr. Corinne Ports     Objective:    Physical Exam: Filed Vitals:   09/22/12 0847  BP: 134/86  Pulse: 62  Temp: 97.1 F (36.2 C)     General: Vital signs reviewed and noted. Well-developed, well-nourished, in no acute distress; alert, appropriate and cooperative throughout examination.  Head: Normocephalic, atraumatic.  Eyes: Conjunctivae/corneas clear. PERRL.  Ears: TM nonerythematous, not bulging, good light reflex bilaterally.  Nose: Mucous membranes moist, not inflammed, nonerythematous.  Throat: Oropharynx nonerythematous, no exudate appreciated.   Neck: No deformities, masses, or tenderness noted. No lymphadenopathy appreciated.   Lungs:  Normal respiratory effort. Clear to auscultation BL without crackles or wheezes.  Heart: RRR. S1 and S2 normal without gallop, rubs. No murmur.  Abdomen:  BS normoactive. Soft, Nondistended, non-tender.  No masses or organomegaly.  Extremities: No pretibial edema.     Assessment/ Plan:   The patient's case and plan of care was discussed with attending physician, Dr. Ulyess Mort.

## 2012-09-23 LAB — LUTEINIZING HORMONE: LH: 3.7 m[IU]/mL

## 2012-09-27 NOTE — Progress Notes (Signed)
Agree with notes and plans. 

## 2012-10-22 ENCOUNTER — Ambulatory Visit (INDEPENDENT_AMBULATORY_CARE_PROVIDER_SITE_OTHER): Payer: Self-pay | Admitting: Internal Medicine

## 2012-10-22 ENCOUNTER — Ambulatory Visit (HOSPITAL_COMMUNITY)
Admission: RE | Admit: 2012-10-22 | Discharge: 2012-10-22 | Disposition: A | Discharge status: Home / Self Care | Payer: Self-pay | Source: Ambulatory Visit | Attending: Internal Medicine | Admitting: Internal Medicine

## 2012-10-22 ENCOUNTER — Encounter: Payer: Self-pay | Admitting: Internal Medicine

## 2012-10-22 ENCOUNTER — Ambulatory Visit: Payer: Self-pay | Admitting: Internal Medicine

## 2012-10-22 VITALS — BP 143/86 | HR 69 | Temp 97.6°F | Ht 63.4 in | Wt 140.8 lb

## 2012-10-22 DIAGNOSIS — M542 Cervicalgia: Secondary | ICD-10-CM

## 2012-10-22 DIAGNOSIS — R61 Generalized hyperhidrosis: Secondary | ICD-10-CM

## 2012-10-22 MED ORDER — IBUPROFEN 200 MG PO TABS: 200.0000 mg | ORAL_TABLET | Freq: Four times a day (QID) | ORAL | Status: AC | PRN

## 2012-10-22 NOTE — Patient Instructions (Signed)
If you are experiencing worsening swelling, redness and significant pain she is to go to the emergency room .

## 2012-10-22 NOTE — Assessment & Plan Note (Signed)
Most likely due to inflammation after gagging and choking. Will obtain an x-ray of the neck to evaluate for any foreign bodies. We will reevaluate patient in 2 weeks. Patient was instructed if she is experiencing worsening swelling, redness and significant pain she is to go to the emergency room .

## 2012-10-22 NOTE — Progress Notes (Signed)
Subjective:   Patient ID: Lori Murray female   DOB: 1970/09/10 42 y.o.   MRN: [id number]  HPI: [name] is a 42 y.o. female who presented to the clinic after trouble starting on estrogen replacement therapy as well as neck pain. 1. Patient reports that after starting estrogen replacement therapy a month ago she has been experiencing significant arm swelling under her eyes, noted itchiness, is feeling more fatigued, has experiencing increased hair growth on her face,pimples,  episodes of neck pain and achiness in her back. Patient further noted that she has episodes of vision changes without any headaches or weakness, numbness, tingling. Patient denies any chest pain, shortness of breath, leg swelling. Patient stop smoking one month ago. Prior to starting estrogen therapy. Patient noted that she was started on Estring therapy because she has been experiencing significant sweating all day long but also or night sweats with hot flashes. She never was person who would sweat a lot and is very uncomfortable. She would wake up at night because she is sweaty. Patient underwent hysterectomy 4 years ago.  2. Neck pain: patient reports 4 days ago she was choking on a bone.she tried to get it out but she was only able to cough and gag. Patient thinks the bone is still stuck in her throat but she is able to eat and drink normally. Denies any nausea or vomiting. But since the episode she has been experiencing left face and neck pain. Denies any redness or mass.denies any fevers or rashes although she feels all the time hot and sweaty.      Past Medical History  Diagnosis Date  . History of tension headache   . History of tobacco abuse   . Gardnerella vaginalis infection     recurrent  . Metrorrhagia     h/o treated with OCP.  Marland Kitchen Depression   . Anemia     iron deficiency  . Fibroids     s/p TAH- right salpingo- oophorectomy in 2010  . Horner's syndrome     dry eye syndrome:  OS>OD 2/2 horner's syndrome. followed at [facility] center; LUL ptosis 2/2 Horner's; f/u with Dr. Corinne Ports   Current Outpatient Prescriptions  Medication Sig Dispense Refill  . Artificial Saliva (BIOTENE MOISTURIZING MOUTH) SOLN Use as directed in the mouth or throat.        . estrogens, conjugated, (PREMARIN) 0.625 MG tablet Take 1 tablet (0.625 mg total) by mouth daily.  30 tablet  0  . Polyethyl Glycol-Propyl Glycol (SYSTANE ULTRA) 0.4-0.3 % SOLN Apply to eye.        . venlafaxine XR (EFFEXOR XR) 75 MG 24 hr capsule Take 1 capsule (75 mg total) by mouth daily.  30 capsule  2   No current facility-administered medications for this visit.   Family History  Problem Relation Age of Onset  . Diabetes Neg Hx   . Hypertension Neg Hx   . Depression Neg Hx    History   Social History  . Marital Status: Legally Separated    Spouse Name: N/A    Number of Children: N/A  . Years of Education: N/A   Social History Main Topics  . Smoking status: Current Some Day Smoker -- 0.10 packs/day    Types: Cigarettes  . Smokeless tobacco: None     Comment: stopped 1 month ago  . Alcohol Use: No  . Drug Use: No  . Sexually Active: None   Other Topics Concern  . None   Social History  Narrative   ** Merged History Encounter **       Regular exercise- yes.   Review of Systems: Constitutional: Denies fever, chills, diaphoresis, appetite change and fatigue.  Respiratory: Denies SOB, DOE, cough, chest tightness,  and wheezing.   Cardiovascular: Denies chest pain, palpitations and leg swelling.  Gastrointestinal: Denies nausea, vomiting, abdominal pain, diarrhea, constipation, Skin: Denies pallor, rash and wound.  Neurological: Denies dizziness,   Objective:  Physical Exam: Filed Vitals:   10/22/12 1524  BP: 143/86  Pulse: 69  Temp: 97.6 F (36.4 C)  TempSrc: Oral  Height: 5' 3.4" (1.61 m)  Weight: 140 lb 12.8 oz (63.866 kg)  SpO2: 100%   Constitutional: Vital signs reviewed.  Patient is  a well-developed and well-nourished female in no acute distress and cooperative with exam. Alert and oriented x3.  Eyes: PERRL, EOMI, conjunctivae normal, No scleral icterus.  Neck: Supple, mildly tender to palpation on the lateral side of neck left-sided. No erythema, no mass, no swelling present. Normal sensation. Cardiovascular: RRR, S1 normal, S2 normal, no MRG, pulses symmetric and intact bilaterally Pulmonary/Chest: CTAB, no wheezes, rales, or rhonchi Abdominal: Soft. Non-tender, non-distended, bowel sounds are normal,  Hematology: no cervical  adenopathy.  Neurological: A&O x3, Strength is normal and symmetric bilaterally, cranial nerve II-XII are grossly intact, no focal motor deficit, sensory intact to light touch bilaterally.  Skin: Warm, dry and intact. No rash, cyanosis, or clubbing.

## 2012-10-22 NOTE — Assessment & Plan Note (Signed)
TSH, FSH, LH within normal limits. Since patient is expressing significant side effects we'll discontinue estrogen therapy. Patient at this point would not like to take any medication but likely will watch in the near future.

## 2012-10-23 NOTE — Progress Notes (Signed)
Case discussed with Dr. Illath immediately after the resident saw the patient. We reviewed the resident's history and exam and pertinent patient test results. I agree with the assessment, diagnosis and plan of care documented in the resident's note. 

## 2012-11-05 ENCOUNTER — Encounter: Payer: Self-pay | Admitting: Internal Medicine

## 2012-11-05 ENCOUNTER — Ambulatory Visit (INDEPENDENT_AMBULATORY_CARE_PROVIDER_SITE_OTHER): Payer: Self-pay | Admitting: Internal Medicine

## 2012-11-05 VITALS — BP 106/73 | HR 84 | Temp 98.4°F | Ht 63.5 in | Wt 140.6 lb

## 2012-11-05 DIAGNOSIS — F32A Depression, unspecified: Secondary | ICD-10-CM

## 2012-11-05 DIAGNOSIS — F329 Major depressive disorder, single episode, unspecified: Secondary | ICD-10-CM

## 2012-11-05 DIAGNOSIS — M542 Cervicalgia: Secondary | ICD-10-CM

## 2012-11-05 MED ORDER — VENLAFAXINE HCL ER 75 MG PO CP24: 75.0000 mg | ORAL_CAPSULE | Freq: Every day | ORAL | Status: DC

## 2012-11-05 NOTE — Progress Notes (Signed)
Subjective:   Patient ID: Lori Murray female   DOB: [birth year] 42 y.o.   MRN: [id number]  HPI: Lori Murray is a 42 y.o. female with past history significant as outlined below who presented for followup of neck pain on the left side. An x-ray which was obtained during the last office visit provider for any for foreign bodies  was negative. Patient noted that she has been doing fine. No more pain in her neck area. Denies any swelling, redness, fevers or chills, trouble swallowing    Past Medical History  Diagnosis Date  . History of tension headache   . History of tobacco abuse   . Gardnerella vaginalis infection     recurrent  . Metrorrhagia     h/o treated with OCP.  Marland Kitchen Depression   . Anemia     iron deficiency  . Fibroids     s/p TAH- right salpingo- oophorectomy in 2010  . 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 followed at [facility] center; LUL ptosis 2/2 Horner's; f/u with Dr. Corinne Ports   Current Outpatient Prescriptions  Medication Sig Dispense Refill  . Artificial Saliva (BIOTENE MOISTURIZING MOUTH) SOLN Use as directed in the mouth or throat.        Marland Kitchen ibuprofen (ADVIL) 200 MG tablet Take 1 tablet (200 mg total) by mouth every 6 (six) hours as needed for pain.  100 tablet  2  . Polyethyl Glycol-Propyl Glycol (SYSTANE ULTRA) 0.4-0.3 % SOLN Apply to eye.        . venlafaxine XR (EFFEXOR XR) 75 MG 24 hr capsule Take 1 capsule (75 mg total) by mouth daily.  30 capsule  3   No current facility-administered medications for this visit.   Family History  Problem Relation Age of Onset  . Diabetes Neg Hx   . Hypertension Neg Hx   . Depression Neg Hx    History   Social History  . Marital Status: Legally Separated    Spouse Name: N/A    Number of Children: N/A  . Years of Education: N/A   Social History Main Topics  . Smoking status: Former Smoker -- 0.10 packs/day    Types: Cigarettes  . Smokeless tobacco: None     Comment:  stopped 1 month ago  . Alcohol Use: No  . Drug Use: No  . Sexually Active: None   Other Topics Concern  . None   Social History Narrative   ** Merged History Encounter **       Regular exercise- yes.   Review of Systems: Constitutional: Denies fever, chills, diaphoresis, appetite change and fatigue.  HEENT: Denies neck pain, neck stiffness and tinnitus.   Respiratory: Denies SOB, DOE, cough, chest tightness,  and wheezing.   Cardiovascular: Denies chest pain, palpitations and leg swelling.  Skin: Denies pallor, rash and wound.   Objective:  Physical Exam: Filed Vitals:   11/05/12 1353  BP: 106/73  Pulse: 84  Temp: 98.4 F (36.9 C)  TempSrc: Oral  Height: 5' 3.5" (1.613 m)  Weight: 140 lb 9.6 oz (63.776 kg)  SpO2: 98%   Constitutional: Vital signs reviewed.  Patient is a well-developed and well-nourished female in no acute distress and cooperative with exam. Alert and oriented x3. .  Neck: Supple, Trachea midline normal ROM, No JVD, mass,  Cardiovascular: RRR, S1 normal, S2 normal, no MRG, pulses symmetric and intact bilaterally Pulmonary/Chest: normal respiratory effort, CTAB, no wheezes, rales, or rhonchi Abdominal: Soft. Non-tender, non-distended,  bowel sounds are normal,  Neurological: A&O x3

## 2012-11-05 NOTE — Assessment & Plan Note (Signed)
Patient noted that her neck pain has resolved. X-ray obtained during the last office visit were negative for any foreign bodies.

## 2012-11-09 NOTE — Progress Notes (Signed)
Case discussed with Dr. Jaseela Illath  at the time of the visit, immediately after the resident saw the patient.  I reviewed the resident's history and exam and pertinent patient test results.  I agree with the assessment, diagnosis and plan of care documented in the resident's note.   

## 2013-01-20 ENCOUNTER — Emergency Department (INDEPENDENT_AMBULATORY_CARE_PROVIDER_SITE_OTHER)
Admission: EM | Admit: 2013-01-20 | Discharge: 2013-01-20 | Disposition: A | Discharge status: Home / Self Care | Payer: Self-pay | Source: Home / Self Care | Attending: Emergency Medicine | Admitting: Emergency Medicine

## 2013-01-20 ENCOUNTER — Emergency Department (INDEPENDENT_AMBULATORY_CARE_PROVIDER_SITE_OTHER): Payer: Self-pay

## 2013-01-20 ENCOUNTER — Telehealth: Payer: Self-pay | Admitting: *Deleted

## 2013-01-20 ENCOUNTER — Encounter (HOSPITAL_COMMUNITY): Payer: Self-pay | Admitting: Emergency Medicine

## 2013-01-20 DIAGNOSIS — J209 Acute bronchitis, unspecified: Secondary | ICD-10-CM

## 2013-01-20 DIAGNOSIS — J019 Acute sinusitis, unspecified: Secondary | ICD-10-CM

## 2013-01-20 MED ORDER — AMOXICILLIN 500 MG PO CAPS: 1000.0000 mg | ORAL_CAPSULE | Freq: Three times a day (TID) | ORAL | Status: DC

## 2013-01-20 MED ORDER — ALBUTEROL SULFATE HFA 108 (90 BASE) MCG/ACT IN AERS: 2.0000 | INHALATION_SPRAY | Freq: Four times a day (QID) | RESPIRATORY_TRACT | Status: DC

## 2013-01-20 MED ORDER — PREDNISONE 20 MG PO TABS: 20.0000 mg | ORAL_TABLET | Freq: Two times a day (BID) | ORAL | Status: DC

## 2013-01-20 MED ORDER — FEXOFENADINE-PSEUDOEPHED ER 60-120 MG PO TB12: 1.0000 | ORAL_TABLET | Freq: Two times a day (BID) | ORAL | Status: DC

## 2013-01-20 MED ORDER — BENZONATATE 200 MG PO CAPS: 200.0000 mg | ORAL_CAPSULE | Freq: Three times a day (TID) | ORAL | Status: DC | PRN

## 2013-01-20 NOTE — Telephone Encounter (Signed)
Daughter calls and states pt has N&V, cough,? Fevers. Feels horrible x 4 days. Not taking po fluids well nor food. She is ask to take pt to urg care asap

## 2013-01-20 NOTE — ED Notes (Signed)
Reports uri : sore throat, cough, productive cough with green, blood tinged phlegm.  Patient appears to feel very bad

## 2013-01-20 NOTE — ED Provider Notes (Signed)
Chief Complaint:   Chief Complaint  Patient presents with  . URI    History of Present Illness:   Lori Murray is a 42 year old female who has had an 8 day history that began with nausea, vomiting, and diarrhea. This became worse but in the meantime she developed nasal congestion, postnasal drip, sore throat, myalgias, and cough productive of green, blood-tinged sputum. Her chest felt tight and somewhat hot and she had aching in the posterior chest. She had a temperature of up to 103-104.  Review of Systems:  Other than noted above, the patient denies any of the following symptoms: Systemic:  No fevers, chills, sweats, weight loss or gain, fatigue, or tiredness. Eye:  No redness or discharge. ENT:  No ear pain, drainage, headache, nasal congestion, drainage, sinus pressure, difficulty swallowing, or sore throat. Neck:  No neck pain or swollen glands. Lungs:  No cough, sputum production, hemoptysis, wheezing, chest tightness, shortness of breath or chest pain. GI:  No abdominal pain, nausea, vomiting or diarrhea.  PMFSH:  Past medical history, family history, social history, meds, and allergies were reviewed.   Physical Exam:   Vital signs:  BP 115/71  Pulse 73  Temp(Src) 98.7 F (37.1 C) (Oral)  Resp 18  SpO2 97%  LMP 07/28/2008 General:  Alert and oriented.  In no distress.  Skin warm and dry. Eye:  No conjunctival injection or drainage. Lids were normal. ENT:  TMs and canals were normal, without erythema or inflammation.  Nasal mucosa was clear and uncongested, without drainage.  Mucous membranes were moist.  Pharynx was clear with no exudate or drainage.  There were no oral ulcerations or lesions. Neck:  Supple, no adenopathy, tenderness or mass. Lungs:  No respiratory distress.  Lungs were clear to auscultation, without wheezes, rales or rhonchi.  Breath sounds were clear and equal bilaterally.  Heart:  Regular rhythm, without gallops, murmers or rubs. Skin:  Clear, warm,  and dry, without rash or lesions.  Radiology:  Dg Chest 2 View  01/20/2013   *RADIOLOGY REPORT*  Clinical Data: Cough and fever  CHEST - 2 VIEW  Comparison: July 30, 2011  Findings: Lungs clear.  Heart size and pulmonary vascularity are normal.  No adenopathy.  No bone lesions.  IMPRESSION: No abnormality noted.   Original Report Authenticated By: Bretta Bang, M.D.   Assessment:  The primary encounter diagnosis was Acute bronchitis. A diagnosis of Acute sinusitis was also pertinent to this visit.  Plan:   1.  The following meds were prescribed:   Discharge Medication List as of 01/20/2013 11:52 AM    START taking these medications   Details  albuterol (PROVENTIL HFA;VENTOLIN HFA) 108 (90 BASE) MCG/ACT inhaler Inhale 2 puffs into the lungs 4 (four) times daily., Starting 01/20/2013, Until Discontinued, Normal    amoxicillin (AMOXIL) 500 MG capsule Take 2 capsules (1,000 mg total) by mouth 3 (three) times daily., Starting 01/20/2013, Until Discontinued, Normal    benzonatate (TESSALON) 200 MG capsule Take 1 capsule (200 mg total) by mouth 3 (three) times daily as needed for cough., Starting 01/20/2013, Until Discontinued, Normal    fexofenadine-pseudoephedrine (ALLEGRA-D) 60-120 MG per tablet Take 1 tablet by mouth every 12 (twelve) hours., Starting 01/20/2013, Until Discontinued, Normal    predniSONE (DELTASONE) 20 MG tablet Take 1 tablet (20 mg total) by mouth 2 (two) times daily., Starting 01/20/2013, Until Discontinued, Normal       2.  The patient was instructed in symptomatic care and handouts were given. 3.  The patient was told to return if becoming worse in any way, if no better in 3 or 4 days, and given some red flag symptoms such as worsening fever or any degree of respiratory distress that would indicate earlier return. 4.  Follow up here as needed.      Reuben Likes, MD 01/20/13 [phone]

## 2013-08-09 ENCOUNTER — Ambulatory Visit: Payer: Self-pay | Admitting: Internal Medicine

## 2013-10-26 ENCOUNTER — Ambulatory Visit (INDEPENDENT_AMBULATORY_CARE_PROVIDER_SITE_OTHER): Payer: Self-pay | Admitting: Internal Medicine

## 2013-10-26 VITALS — BP 117/77 | HR 61 | Temp 97.4°F | Ht 63.5 in | Wt 139.0 lb

## 2013-10-26 DIAGNOSIS — N898 Other specified noninflammatory disorders of vagina: Secondary | ICD-10-CM

## 2013-10-26 DIAGNOSIS — A499 Bacterial infection, unspecified: Secondary | ICD-10-CM

## 2013-10-26 DIAGNOSIS — B9689 Other specified bacterial agents as the cause of diseases classified elsewhere: Secondary | ICD-10-CM

## 2013-10-26 DIAGNOSIS — N760 Acute vaginitis: Secondary | ICD-10-CM

## 2013-10-26 DIAGNOSIS — R109 Unspecified abdominal pain: Secondary | ICD-10-CM | POA: Insufficient documentation

## 2013-10-26 LAB — BASIC METABOLIC PANEL WITH GFR
BUN: 16 mg/dL (ref 6–23)
CO2: 26 mEq/L (ref 19–32)
Calcium: 9 mg/dL (ref 8.4–10.5)
Chloride: 105 mEq/L (ref 96–112)
Creat: 0.71 mg/dL (ref 0.50–1.10)
GFR, Est Non African American: >89 mL/min
Glucose, Bld: 90 mg/dL (ref 70–99)
POTASSIUM: 4.2 meq/L (ref 3.5–5.3)
SODIUM: 138 meq/L (ref 135–145)

## 2013-10-26 LAB — CBC WITH DIFFERENTIAL/PLATELET
BASOS ABS: 0 10*3/uL (ref 0.0–0.1)
BASOS PCT: 0 % (ref 0–1)
Eosinophils Absolute: 0.1 10*3/uL (ref 0.0–0.7)
Eosinophils Relative: 1 % (ref 0–5)
HCT: 35.1 % — ABNORMAL LOW (ref 36.0–46.0)
Hemoglobin: 11.8 g/dL — ABNORMAL LOW (ref 12.0–15.0)
Lymphocytes Relative: 29 % (ref 12–46)
Lymphs Abs: 1.8 10*3/uL (ref 0.7–4.0)
MCH: 30.3 pg (ref 26.0–34.0)
MCHC: 33.6 g/dL (ref 30.0–36.0)
MCV: 90 fL (ref 78.0–100.0)
Monocytes Absolute: 0.3 10*3/uL (ref 0.1–1.0)
Monocytes Relative: 5 % (ref 3–12)
NEUTROS ABS: 4.1 10*3/uL (ref 1.7–7.7)
Neutrophils Relative %: 65 % (ref 43–77)
PLATELETS: 241 10*3/uL (ref 150–400)
RBC: 3.9 MIL/uL (ref 3.87–5.11)
RDW: 13.4 % (ref 11.5–15.5)
WBC: 6.3 10*3/uL (ref 4.0–10.5)

## 2013-10-26 MED ORDER — SALINE SPRAY 0.65 % NA SOLN: 1.0000 | NASAL | Status: DC | PRN

## 2013-10-26 MED ORDER — CIPROFLOXACIN HCL 500 MG PO TABS: 500.0000 mg | ORAL_TABLET | Freq: Two times a day (BID) | ORAL | Status: DC

## 2013-10-26 MED ORDER — LORATADINE 10 MG PO TABS: 10.0000 mg | ORAL_TABLET | Freq: Every day | ORAL | Status: DC

## 2013-10-26 MED ORDER — BECLOMETHASONE DIPROPIONATE 80 MCG/ACT NA AERS: 2.0000 | INHALATION_SPRAY | Freq: Every day | NASAL | Status: DC

## 2013-10-26 MED ORDER — CIPROFLOXACIN HCL 500 MG PO TB24: 500.0000 mg | ORAL_TABLET | Freq: Every day | ORAL | Status: DC

## 2013-10-26 NOTE — Assessment & Plan Note (Signed)
Present for the past week. Tenderness on palpation, with pain on flexion and internal rotation of hip joint, likely due to ovarian pathology or UTI. Doubt appendicitis, as reported fever and vomiting has since resolved, pt dosen't look ill, consider futher workup after results of test. Uterus absent, therefore pt cannot have PID. Also consider vulvovaginitis but no erythema, on exam, pending results will start pt on antibiotics. Also start fluconazole pending results and after antibiotics to prevent yeast infection recurrence after UTI treatment.  Plan- Pelvic Uss- r/o Ovarian cysts and torsion. - CBC - BMT - Wet prep, gram stain and Culture - UA (Limited UA in clinic- positive for trace leukocytes) - Start ciprofloxacin- 500mg  BID for 3 days. ( Pt cannot afford the ER which is taken once a day, the prescribed regular cipro which requires BID dosing). - Urine culture.

## 2013-10-26 NOTE — Progress Notes (Signed)
Patient ID: Lori Murray, female   DOB: [birth year], 43 y.o.   MRN: [id number]   Subjective:   Patient ID: Lori Murray female   DOB: 02-26-1971 43 y.o.   MRN: [id number]  HPI: [name] is a 43 y.o. with pmh of schizophrenia, depression presented today with c/o  Abnormal odour, and  vaginal discharge that started 2 weeks ago, pt says she went to a pharmacy here and was prescribed a topical medication which she thinks is metronidazole, and then she started itching.  Right sided abdominal pain started about a week ago, said she also had some abdominal pain around the center of her abdomen, has not progressed in severity. She also said she had fever and chills 3 days ago, she took tylenol and this subsided that day and has not recurred. Endorses 3 episodes of non bloody emesis, same day as fever, no episodes since then. No diarrhea. Pt also endorses frequency during the day and at night and burning on urination present for the past week. Pt sis sexually active with her husband only. Has had a hysterectomy- about 4 years ago.    Past Medical History  Diagnosis Date  . History of tension headache   . History of tobacco abuse   . Gardnerella vaginalis infection     recurrent  . Metrorrhagia     h/o treated with OCP.  Marland Kitchen Depression   . Anemia     iron deficiency  . Fibroids     s/p TAH- right salpingo- oophorectomy in 2010  . 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 followed at [facility] center; LUL ptosis 2/2 Horner's; f/u with Dr. Levan Hurst   Current Outpatient Prescriptions  Medication Sig Dispense Refill  . albuterol (PROVENTIL HFA;VENTOLIN HFA) 108 (90 BASE) MCG/ACT inhaler Inhale 2 puffs into the lungs 4 (four) times daily.  1 Inhaler  0  . amoxicillin (AMOXIL) 500 MG capsule Take 2 capsules (1,000 mg total) by mouth 3 (three) times daily.  60 capsule  0  . Artificial Saliva (BIOTENE MOISTURIZING MOUTH) SOLN Use as directed in the mouth  or throat.        . benzonatate (TESSALON) 200 MG capsule Take 1 capsule (200 mg total) by mouth 3 (three) times daily as needed for cough.  30 capsule  0  . ciprofloxacin (CIPRO XR) 500 MG 24 hr tablet Take 1 tablet (500 mg total) by mouth daily.  3 tablet  0  . fexofenadine-pseudoephedrine (ALLEGRA-D) 60-120 MG per tablet Take 1 tablet by mouth every 12 (twelve) hours.  30 tablet  0  . guaifenesin (ROBITUSSIN) 100 MG/5ML syrup Take 200 mg by mouth 3 (three) times daily as needed for cough.      Vladimir Faster Glycol-Propyl Glycol (SYSTANE ULTRA) 0.4-0.3 % SOLN Apply to eye.        . predniSONE (DELTASONE) 20 MG tablet Take 1 tablet (20 mg total) by mouth 2 (two) times daily.  14 tablet  0  . Pseudoeph-Doxylamine-DM-APAP (NYQUIL PO) Take by mouth.      . Pseudoephedrine-APAP-DM (DAYQUIL PO) Take by mouth.      . venlafaxine XR (EFFEXOR XR) 75 MG 24 hr capsule Take 1 capsule (75 mg total) by mouth daily.  30 capsule  3   No current facility-administered medications for this visit.   Family History  Problem Relation Age of Onset  . Diabetes Neg Hx   . Hypertension Neg Hx   . Depression Neg Hx  History   Social History  . Marital Status: Legally Separated    Spouse Name: N/A    Number of Children: N/A  . Years of Education: N/A   Social History Main Topics  . Smoking status: Former Smoker -- 0.10 packs/day    Types: Cigarettes  . Smokeless tobacco: Not on file     Comment: stopped 1 month ago  . Alcohol Use: No  . Drug Use: No  . Sexual Activity: Not on file   Other Topics Concern  . Not on file   Social History Narrative   ** Merged History Encounter **       Regular exercise- yes.   Review of Systems: CONSTITUTIONAL- No Fever, weightloss, night sweat, endorses poor appetite. SKIN- No Rash, colour changes or itching. HEAD- Headache sometimes present since she has been having above symptoms in HPI or dizziness. EYES- No Vision loss, pain, redness, double or blurred  vision. EARS- No vertigo, hearing loss or ear discharge. Mouth/throat- No Sorethroat, dentures, or bleeding gums. RESPIRATORY- No Cough or SOB. CARDIAC- No Palpitations, DOE, PND or chest pain. NEUROLOGIC- No Numbness, syncope, seizures or burning. [facility]- Denies depression or anxiety.  Objective:  Physical Exam: Filed Vitals:   10/26/13 0916  BP: 117/77  Pulse: 61  Temp: 97.4 F (36.3 C)  TempSrc: Oral  Height: 5' 3.5" (1.613 m)  Weight: 139 lb (63.05 kg)  SpO2: 99%   GENERAL- alert, co-operative, appears as stated age, not in any distress. HEENT- Atraumatic, normocephalic, PERRL, EOMI, oral mucosa appears moist, no cervical LN enlargement, thyroid does not appear enlarged, neck supple. CARDIAC- RRR, no murmurs, rubs or gallops. RESP- Moving equal volumes of air, and clear to auscultation bilaterally, no wheezes or crackles. ABDOMEN- Soft, tenderness right lower quadrant, psoas and obturator positive, but negative rovsings, no guarding no  rebound, no palpable masses or organomegaly, bowel sounds present. Pelvic exam- Speculum- milky white discharge seen in vaginal vault, cervix absent, no redness, swabs obtained, right adnexa tenderness on bimamual exam, no masses appreciated.Marland Kitchen  BACK- Normal curvature of the spine, No tenderness along the vertebrae, no CVA tenderness. NEURO- No obvious Cr N abnormality, strenght upper and lower extremities- 5/5,  Gait- Normal. EXTREMITIES- pulse 2+, symmetric, no pedal edema. SKIN- Warm, dry, No rash or lesion. PSYCH- Normal mood and affect, appropriate thought content and speech.  Assessment & Plan:   The patient's case and plan of care was discussed with attending physician, Dr. Chinita Pester  Please see problem based charting for assessment and plan.

## 2013-10-26 NOTE — Patient Instructions (Addendum)
General Instructions: We will be doing some tests on you, we will call you if we need to start any medications. We will also be prescrining a medication which you will take for three days, take one tablet once a day for three days.   Please bring your medicines with you each time you come to clinic.  Medicines may include prescription medications, over-the-counter medications, herbal remedies, eye drops, vitamins, or other pills.   Progress Toward Treatment Goals:  Treatment Goal 06/21/2012  Stop smoking stopped smoking    Self Care Goals & Plans:  Self Care Goal 10/22/2012  Manage my medications take my medicines as prescribed; bring my medications to every visit; refill my medications on time  Eat healthy foods drink diet soda or water instead of juice or soda; eat more vegetables; eat foods that are low in salt  Be physically active -  Stop smoking -    No flowsheet data found.   Care Management & Community Referrals:  Referral 06/21/2012  Referrals made for care management support none needed

## 2013-10-27 DIAGNOSIS — B9689 Other specified bacterial agents as the cause of diseases classified elsewhere: Secondary | ICD-10-CM | POA: Insufficient documentation

## 2013-10-27 DIAGNOSIS — N760 Acute vaginitis: Secondary | ICD-10-CM | POA: Insufficient documentation

## 2013-10-27 LAB — URINALYSIS, ROUTINE W REFLEX MICROSCOPIC
Bilirubin Urine: NEGATIVE
Glucose, UA: NEGATIVE mg/dL
Hgb urine dipstick: NEGATIVE
Ketones, ur: NEGATIVE mg/dL
Leukocytes, UA: NEGATIVE
NITRITE: NEGATIVE
PROTEIN: NEGATIVE mg/dL
SPECIFIC GRAVITY, URINE: 1.014 (ref 1.005–1.030)
Urobilinogen, UA: 0.2 mg/dL (ref 0.0–1.0)
pH: 6.5 (ref 5.0–8.0)

## 2013-10-27 LAB — URINE CULTURE
COLONY COUNT: NO GROWTH
ORGANISM ID, BACTERIA: NO GROWTH

## 2013-10-27 MED ORDER — METRONIDAZOLE 500 MG PO TABS: 500.0000 mg | ORAL_TABLET | Freq: Two times a day (BID) | ORAL | Status: DC

## 2013-10-27 NOTE — Addendum Note (Signed)
Addended by: Bethena Roys on: 10/27/2013 12:44 PM   Modules accepted: Orders

## 2013-10-27 NOTE — Assessment & Plan Note (Addendum)
Pt presented with vaginal discharge, with foul smell, dark milky colour, and itchy. Wet prep- positive for Gardnerella.  Plan- Called in prescription- Metronidazole 500mg  TID for 7 days. Called pt several times, No voice mail. Called emergency contact, left message on voice mail to call back but that we sent a prescription to her pharmacy which she should pick up.

## 2013-10-27 NOTE — Progress Notes (Signed)
Case discussed with Dr. Emokpae at the time of the visit.  We reviewed the resident's history and exam and pertinent patient test results.  I agree with the assessment, diagnosis, and plan of care documented in the resident's note. 

## 2014-01-05 ENCOUNTER — Ambulatory Visit (INDEPENDENT_AMBULATORY_CARE_PROVIDER_SITE_OTHER): Payer: Self-pay | Admitting: Internal Medicine

## 2014-01-05 ENCOUNTER — Encounter: Payer: Self-pay | Admitting: Internal Medicine

## 2014-01-05 VITALS — BP 111/69 | HR 58 | Temp 97.4°F | Wt 139.7 lb

## 2014-01-05 DIAGNOSIS — N898 Other specified noninflammatory disorders of vagina: Secondary | ICD-10-CM

## 2014-01-05 DIAGNOSIS — R102 Pelvic and perineal pain: Secondary | ICD-10-CM | POA: Insufficient documentation

## 2014-01-05 DIAGNOSIS — N949 Unspecified condition associated with female genital organs and menstrual cycle: Secondary | ICD-10-CM

## 2014-01-05 DIAGNOSIS — R1011 Right upper quadrant pain: Secondary | ICD-10-CM | POA: Insufficient documentation

## 2014-01-05 DIAGNOSIS — N3090 Cystitis, unspecified without hematuria: Secondary | ICD-10-CM | POA: Insufficient documentation

## 2014-01-05 DIAGNOSIS — R300 Dysuria: Secondary | ICD-10-CM

## 2014-01-05 LAB — COMPLETE METABOLIC PANEL WITH GFR
ALBUMIN: 3.9 g/dL (ref 3.5–5.2)
ALT: 10 U/L (ref 0–35)
AST: 16 U/L (ref 0–37)
Alkaline Phosphatase: 78 U/L (ref 39–117)
BUN: 17 mg/dL (ref 6–23)
CALCIUM: 8.7 mg/dL (ref 8.4–10.5)
CHLORIDE: 106 meq/L (ref 96–112)
CO2: 24 meq/L (ref 19–32)
CREATININE: 0.67 mg/dL (ref 0.50–1.10)
GFR, Est Non African American: >89 mL/min
Glucose, Bld: 94 mg/dL (ref 70–99)
POTASSIUM: 4.1 meq/L (ref 3.5–5.3)
Sodium: 138 mEq/L (ref 135–145)
TOTAL PROTEIN: 7 g/dL (ref 6.0–8.3)
Total Bilirubin: 0.3 mg/dL (ref 0.2–1.2)

## 2014-01-05 NOTE — Assessment & Plan Note (Signed)
Dysuria, frequency, lower abdominal pain x 6 days c/w UTI.  She denies N/V, she is afebrile and no CVAT on exam making pyelonephritis unlikely.  She had similar symptoms two months ago and completed a course of Cipro.  Culture negative at that time.  She was found to have BV and was treated with Flagyl. - UA and culture - will hold off on antibiotic treatment until results of UA/culture - pelvic exam - no gross findings, will send wet prep, GC and CT

## 2014-01-05 NOTE — Assessment & Plan Note (Addendum)
She reports recent dyspareunia, lower abd pain (possibly UTI related, test pending), recent yellow discharge (though none seen on my exam) and pain on speculum and manual exam.  Pain is right sided during manual exam.  I can appreciate no masses.  She says this pain has occurred before during pelvic exam and she has no fever, N/V or mass to suggest acute process like torsion.  GC/CT negative in the past (including two months), no vaginal discharge and she is afebrile and well appearing making PID (of the remaining ovary) less likely, however BV positive which can be associated.   - refer to [facility]yn at [facility] or [facility] - both take [employer] - she is meeting with financial counselor here in the clinic today to start application process for [employer] - we will call with ObGyn visit info - she does not need to be seen emergently but she needs transvaginal US and evaluation by ObGyn as soon as possible (within next 1-2 weeks)  - she will follow-up with clinic in 2 weeks

## 2014-01-05 NOTE — Patient Instructions (Signed)
1. Please meet with Lori Murray to reapply for [facility].  I am referring you to OBGyn for your pelvic pain.  We will call with the appointment.  I will call if there are blood or urine abnormalities.   2. Please take all medications as prescribed.    3. If you have worsening of your symptoms or new symptoms arise, please call the clinic ([phone]), or go to the ER immediately if symptoms are severe.   We will see you in clinic in 2 weeks for follow-up.

## 2014-01-05 NOTE — Assessment & Plan Note (Signed)
RUQ pain associated with sitting and sometimes following meals (not specifically fatty meals).  No N/V or change in appetite.  Initially occurred 3 months ago and resolved spontaneously.  She has noticed it again recently.  Intermittent, sometimes lasting hours.  She has prior drinking hx but only social drinking since moving to the [place].  She denies hx of liver or GB disease and liver function test have been normal in the past.  Likely biliary colic. - CMP - she is speaking with financial counselor about turning in necessary documents for the [employer] (she has had one in the past) - abdominal US once she has [employer] - she is meeting with Development worker, community today

## 2014-01-05 NOTE — Progress Notes (Signed)
Subjective:    Patient ID: Lori Murray, female    DOB: March 22, 1971, 43 y.o.   MRN: [id number]  HPI Comments: Lori Murray is a 43 year old with a PMH of schizophrenia, depression and anemia who presents with c/o of dysuria, lower abd pain, frequency, feeling of incomplete of emptying, + chills x 6 days.  She noticed hematuria yesterday (blood on toilet paper).  Denies low back pain, fever, N/V.  Her appetite is good.  Also with vaginal discharge x 6 days.  Discharge is yellow.  Sexually active with husband only.   Denies concern for STDs.  Hx of fibroids with heavy menses and had hysterectomy (2010).  She has right ovary.    RUQ pain x 3 months, feels like something sharp stabbing her in different spots, also feels like pulling sensation.  Pain is 10/10 lasting hours at times.  She notices it when she is sitting for long periods and after some meals.  She works Nurse, adult.  It started 3 months ago and went away but returned yesterday.  Associated symptoms include a single episode heartburn last week after breakfast/coffee, only one episode since last week.  Denies N/V, change in appetite, diarrhea or constipation.  No hx of liver or GB disease.  Says she uses to drink significantly when she lived in [place] (5-6 drinks on weekends but could not tell me if liquor, beer or wine).  Says she only drinks occasionally since moving the [place].  Prior smoking hx, denies drug hx.     Review of Systems  Constitutional: Negative for fever, chills and appetite change.  Cardiovascular: Negative for chest pain, palpitations and leg swelling.  Gastrointestinal: Negative for nausea, vomiting, diarrhea, constipation and blood in stool.  Genitourinary: Positive for dysuria, frequency, hematuria, vaginal discharge and dyspareunia.       GU symptoms x 6 days  Musculoskeletal: Negative for back pain.  Neurological: Negative for syncope, weakness and light-headedness.       Objective:   Physical  Exam  Vitals reviewed. Constitutional: She is oriented to person, place, and time. She appears well-developed. No distress.  HENT:  Head: Normocephalic and atraumatic.  Mouth/Throat: Oropharynx is clear and moist. No oropharyngeal exudate.  Eyes: EOM are normal. Pupils are equal, round, and reactive to light.  Cardiovascular: Normal rate, regular rhythm and normal heart sounds.  Exam reveals no gallop and no friction rub.   No murmur heard. Pulmonary/Chest: Effort normal and breath sounds normal. No respiratory distress. She has no wheezes. She has no rales.  Abdominal: Soft. Bowel sounds are normal. She exhibits no distension and no mass. There is no tenderness. There is no rebound and no guarding.  Genitourinary: Pelvic exam was performed with patient supine. No labial fusion. There is no rash, tenderness, lesion or injury on the right labia. There is no rash, tenderness, lesion or injury on the left labia. Right adnexum displays tenderness. Right adnexum displays no mass and no fullness. Left adnexum displays no mass, no tenderness and no fullness. There is tenderness around the vagina. No erythema or bleeding around the vagina. No foreign body around the vagina. No vaginal discharge found.  Uterus/cervix surgically absent  Musculoskeletal: Normal range of motion. She exhibits no edema and no tenderness.  Lymphadenopathy:    She has no cervical adenopathy.  Neurological: She is alert and oriented to person, place, and time. No cranial nerve deficit.  Skin: Skin is warm. She is not diaphoretic.  Psychiatric: She has a normal mood  and affect. Her behavior is normal.          Assessment & Plan:  Please see problem based assessment and plan.

## 2014-01-06 ENCOUNTER — Telehealth: Payer: Self-pay | Admitting: *Deleted

## 2014-01-06 LAB — URINALYSIS, MICROSCOPIC ONLY
Casts: NONE SEEN
Crystals: NONE SEEN

## 2014-01-06 LAB — URINALYSIS, ROUTINE W REFLEX MICROSCOPIC
Bilirubin Urine: NEGATIVE
GLUCOSE, UA: NEGATIVE mg/dL
HGB URINE DIPSTICK: NEGATIVE
Ketones, ur: NEGATIVE mg/dL
Nitrite: POSITIVE — AB
PH: 5 (ref 5.0–8.0)
Protein, ur: NEGATIVE mg/dL
SPECIFIC GRAVITY, URINE: 1.025 (ref 1.005–1.030)
Urobilinogen, UA: 1 mg/dL (ref 0.0–1.0)

## 2014-01-06 NOTE — Telephone Encounter (Signed)
Lab orders being questioned, transferred to lab

## 2014-01-06 NOTE — Progress Notes (Signed)
Case discussed with Dr. Wilson at the time of the visit.  We reviewed the resident's history and exam and pertinent patient test results.  I agree with the assessment, diagnosis, and plan of care documented in the resident's note. 

## 2014-01-07 LAB — URINE CULTURE: Colony Count: 70000

## 2014-01-09 ENCOUNTER — Telehealth: Payer: Self-pay | Admitting: *Deleted

## 2014-01-09 ENCOUNTER — Other Ambulatory Visit: Payer: Self-pay | Admitting: Internal Medicine

## 2014-01-09 ENCOUNTER — Ambulatory Visit: Payer: Self-pay

## 2014-01-09 ENCOUNTER — Telehealth: Payer: Self-pay | Admitting: Internal Medicine

## 2014-01-09 LAB — GC/CHLAMYDIA PROBE AMP
CT PROBE, AMP APTIMA: NEGATIVE
GC PROBE AMP APTIMA: NEGATIVE

## 2014-01-09 MED ORDER — NITROFURANTOIN MONOHYD MACRO 100 MG PO CAPS: 100.0000 mg | ORAL_CAPSULE | Freq: Two times a day (BID) | ORAL | Status: DC

## 2014-01-09 NOTE — Addendum Note (Signed)
Addended by: Truddie Crumble on: 01/09/2014 09:12 AM   Modules accepted: Orders

## 2014-01-09 NOTE — Telephone Encounter (Signed)
Ms. Eble culture resulted over the weekend so I have just received the results this morning.  I have not yet called her and I am not sure who would have called her regarding a prescription.  Now that I have the results I will send in the appropriate antibiotic and call the patient with the information.

## 2014-01-09 NOTE — Telephone Encounter (Signed)
I called and informed the patient that I have prescribed nitrofurantoin 100mg  BID x 7days for her UTI.  She agrees to pick it up from her pharmacy.

## 2014-01-09 NOTE — Telephone Encounter (Signed)
Pt presents and states" doctor lady call me and tell me prescription at [place], i go , they tell me, no medicine for you. What i do" please advise

## 2014-01-19 ENCOUNTER — Ambulatory Visit: Payer: Self-pay | Admitting: Internal Medicine

## 2014-01-19 ENCOUNTER — Encounter: Payer: Self-pay | Admitting: Internal Medicine

## 2014-02-02 ENCOUNTER — Encounter: Payer: Self-pay | Admitting: Obstetrics & Gynecology

## 2014-03-13 ENCOUNTER — Encounter: Payer: Self-pay | Admitting: Obstetrics & Gynecology

## 2014-03-13 ENCOUNTER — Ambulatory Visit (INDEPENDENT_AMBULATORY_CARE_PROVIDER_SITE_OTHER): Payer: Self-pay | Admitting: Obstetrics & Gynecology

## 2014-03-13 VITALS — BP 117/70 | HR 69 | Temp 98.1°F | Ht 62.0 in | Wt 140.4 lb

## 2014-03-13 DIAGNOSIS — R1031 Right lower quadrant pain: Secondary | ICD-10-CM

## 2014-03-13 NOTE — Progress Notes (Signed)
Patient ID: Lori Murray, female   DOB: 08-May-1971, 43 y.o.   MRN: [id number]  Chief Complaint  Patient presents with  . Gynecologic Exam    HPI Lori Murray is a 43 y.o. female.  [id number] s/p TAH/RSO 06/2008 for fibroids comes for evalulation of RLQ pain, intermittent for last 8 mo, may last 30 min.    HPI  Past Medical History  Diagnosis Date  . History of tension headache   . History of tobacco abuse   . Gardnerella vaginalis infection     recurrent  . Metrorrhagia     h/o treated with OCP.  Marland Kitchen Depression   . Anemia     iron deficiency  . Fibroids     s/p TAH- right salpingo- oophorectomy in 2010  . 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 followed at [facility] center; LUL ptosis 2/2 Horner's; f/u with Dr. Levan Hurst    Past Surgical History  Procedure Laterality Date  . Benign schwannoma   06/20/09    excision from left neck, by Dr. Deniece Ree [facility])  . Tumor removal    . Oophorectomy      L  . Abdominoplasty      Family History  Problem Relation Age of Onset  . Diabetes Neg Hx   . Hypertension Neg Hx   . Depression Neg Hx     Social History History  Substance Use Topics  . Smoking status: Former Smoker -- 0.10 packs/day    Types: Cigarettes  . Smokeless tobacco: Not on file     Comment: stopped 1 month ago  . Alcohol Use: No    No Known Allergies  Current Outpatient Prescriptions  Medication Sig Dispense Refill  . Artificial Saliva (BIOTENE MOISTURIZING MOUTH) SOLN Use as directed in the mouth or throat.        . Multiple Vitamin (MULTIVITAMIN) tablet Take 1 tablet by mouth daily.      Marland Kitchen albuterol (PROVENTIL HFA;VENTOLIN HFA) 108 (90 BASE) MCG/ACT inhaler Inhale 2 puffs into the lungs 4 (four) times daily.  1 Inhaler  0  . benzonatate (TESSALON) 200 MG capsule Take 1 capsule (200 mg total) by mouth 3 (three) times daily as needed for cough.  30 capsule  0  . fexofenadine-pseudoephedrine (ALLEGRA-D) 60-120 MG per  tablet Take 1 tablet by mouth every 12 (twelve) hours.  30 tablet  0  . guaifenesin (ROBITUSSIN) 100 MG/5ML syrup Take 200 mg by mouth 3 (three) times daily as needed for cough.      . nitrofurantoin, macrocrystal-monohydrate, (MACROBID) 100 MG capsule Take 1 capsule (100 mg total) by mouth 2 (two) times daily.  14 capsule  0  . Polyethyl Glycol-Propyl Glycol (SYSTANE ULTRA) 0.4-0.3 % SOLN Apply to eye.        . predniSONE (DELTASONE) 20 MG tablet Take 1 tablet (20 mg total) by mouth 2 (two) times daily.  14 tablet  0  . Pseudoeph-Doxylamine-DM-APAP (NYQUIL PO) Take by mouth.      . Pseudoephedrine-APAP-DM (DAYQUIL PO) Take by mouth.      . venlafaxine XR (EFFEXOR XR) 75 MG 24 hr capsule Take 1 capsule (75 mg total) by mouth daily.  30 capsule  3   No current facility-administered medications for this visit.    Review of Systems Review of Systems  Constitutional: Positive for appetite change and unexpected weight change.  Gastrointestinal: Positive for abdominal pain. Negative for nausea, constipation and blood in stool.  Genitourinary: Positive for  pelvic pain. Negative for dysuria, hematuria, vaginal bleeding and vaginal discharge.    Blood pressure 117/70, pulse 69, temperature 98.1 F (36.7 C), height 5\' 2"  (1.575 m), weight 63.685 kg (140 lb 6.4 oz), last menstrual period 07/28/2008.  Physical Exam Physical Exam  Nursing note and vitals reviewed. Constitutional: She is oriented to person, place, and time. She appears well-developed. No distress.  Pulmonary/Chest: Effort normal. No respiratory distress.  Abdominal: Soft.  Extensive surgical scars  Genitourinary: Vagina normal. No vaginal discharge found.  No mass, cuff nl. R side tender  Musculoskeletal: Normal range of motion.  Neurological: She is alert and oriented to person, place, and time.  Skin: Skin is warm and dry.  Psychiatric: She has a normal mood and affect.    Data Reviewed CT 2010  Assessment    RLQ pain  and tenderness chronic    Plan    CT abd pelvic, RTC 3 weeks        [name] 03/13/2014, 2:22 PM

## 2014-03-13 NOTE — Patient Instructions (Signed)
Dolor abdominal en las mujeres °(Abdominal Pain, Women) °El dolor abdominal (en el estómago, la pelvis o el vientre) puede tener muchas causas. Es importante que le informe a su médico: °· La ubicación del dolor. °· ¿Viene y se va, o persiste todo el tiempo? °· ¿Hay situaciones que inician el dolor (comer ciertos alimentos, la actividad física)? °· ¿Tiene otros síntomas asociados al dolor (fiebre, náuseas, vómitos, diarrea)? °Todo es de gran ayuda cuando se trata de hallar la causa del dolor. °CAUSAS °· Estómago: Infecciones por virus o bacterias, o úlcera. °· Intestino: Apendicitis (apéndice inflamado), ileitis regional (enfermedad de Crohn), colitis ulcerosa (colon inflamado), síndrome del colon irritable, diverticulitis (inflamación de los divertículos del colon) o cáncer de estómago oo intestino. °· Enfermedades de la vesícula biliar o cálculos. °· Enfermedades renales, cálculos o infecciones en el riñón. °· Infección o cáncer del páncreas. °· Fibromialgia (trastorno doloroso) °· Enfermedades de los órganos femeninos: °¨ Uterus: Útero: fibroma (tumor no canceroso) o infección °¨ Trompas de Falopio: infección o embarazo ectópico °¨ En los ovarios, quistes o tumores. °¨ Adherencias pélvicas (tejido cicatrizal). °¨ Endometriosis (el tejido que cubre el útero se desarrolla en la pelvis y los órganos pélvicos). °¨ Síndrome de congestión pélvica (los órganos femeninos se llenan de sangre antes del periodo menstrual( °¨ Dolor durante el periodo menstrual. °¨ Dolor durante la ovulación (al producir óvulos). °¨ Dolor al usar el DIU (dispositivo intrauterino para el control de la natalidad) °¨ Cáncer en los órganos femeninos. °· Dolor funcional (no está originado en una enfermedad, puede mejorar sin tratamiento). °· Dolor de origen psicológico °· Depresión. °DIAGNÓSTICO °Su médico decidirá la gravedad del dolor a través del examen físico °· Análisis de sangre °· Radiografías °· Ecografías °· TC (tomografía computada, tipo  especial de radiografías). °· IMR (resonancia magnética) °· Cultivos, en el caso una infección °· Colon por enema de bario (se inserta una sustancia de contraste en el intestino grueso para mejorar la observación con rayos X.) °· Colonoscopía (observación del intestino con un tubo luminoso). °· Laparoscopía (examen del interior del abdomen con un tubo que tiene una luz). °· Cirugía exploratoria abdominal mayor (se observa el abdomen realizando una gran incisión). °TRATAMIENTO °El tratamiento dependerá de la causa del problema.  °· Muchos de estos casos pueden controlarse y tratarse en casa. °· Medicamentos de venta libre indicados por el médico. °· Medicamentos con receta. °· Antibióticos, en caso de infección °· Píldoras anticonceptivas, en el caso de períodos dolorosos o dolor al ovular. °· Tratamiento hormonal, para la endometriosis °· Inyecciones para bloqueo nervioso selectivo. °· Fisioterapia. °· Antidepresivos. °· Consejos por parte de un psícólogo o psiquiatra. °· Cirugía mayor o menor. °INSTRUCCIONES PARA EL CUIDADO DOMICILIARIO °· No tome ni administre laxantes a menos que se lo haya indicado su médico. °· Tome analgésicos de venta libre sólo si se lo ha indicado el profesional que lo asiste. No tome aspirina, ya que puede causar molestias en el estómago o hemorragias. °· Consuma una dieta líquida (caldo o agua) según lo indicado por el médico. Progrese lentamente a una dieta blanda, según la tolerancia, si el dolor se relaciona con el estómago o el intestino. °· Tenga un termómetro y tómese la temperatura varias veces al día. °· Haga reposo en la cama y duerma, si esto alivia el dolor. °· Evite las relaciones sexuales, si le producen dolor. °· Evite las situaciones estresantes. °· Cumpla con las visitas y los análisis de control, según las indicaciones de su médico. °· Si el dolor   no se alivia con los medicamentos o la cirugía, puede tratar con: °¨ Acupuntura. °¨ Ejercicios de relajación (yoga,  meditación). °¨ Terapia grupal. °¨ Psicoterapia. °SOLICITE ATENCIÓN MÉDICA SI: °· Nota que ciertos alimentos le producen dolor de estómago. °· El tratamiento indicado para realizar en el hogar no le alivia el dolor. °· Necesita analgésicos más fuertes. °· Quiere que le retiren el DIU. °· Si se siente confundido o desfalleciente. °· Presenta náuseas o vómitos. °· Aparece una erupción cutánea. °· Sufre efectos adversos o una reacción alérgica debido a los medicamentos que toma. °SOLICITE ATENCIÓN MÉDICA DE INMEDIATO SI: °· El dolor persiste o se agrava. °· Tiene fiebre. °· Siente el dolor sólo en algunos sectores del abdomen. Si se localiza en la zona derecha, posiblemente podría tratarse de apendicitis. En un adulto, si se localiza en la región inferior izquierda del abdomen, podría tratarse de colitis o diverticulitis. °· Hay sangre en las heces (deposiciones de color rojo brillante o negro alquitranado), con o sin vómitos. °· Usted presenta sangre en la orina. °· Siente escalofríos con o sin fiebre. °· Se desmaya. °ASEGÚRESE QUE:  °· Comprende estas instrucciones. °· Controlará su enfermedad. °· Solicitará ayuda de inmediato si no mejora o si empeora. °Document Released: 09/11/2008 Document Revised: 08/18/2011 °ExitCare® Patient Information ©2015 [employer]. This information is not intended to replace advice given to you by your health care provider. Make sure you discuss any questions you have with your health care provider. ° °

## 2014-03-16 ENCOUNTER — Ambulatory Visit (HOSPITAL_COMMUNITY): Payer: Self-pay

## 2014-03-20 ENCOUNTER — Ambulatory Visit (HOSPITAL_COMMUNITY)
Admission: RE | Admit: 2014-03-20 | Discharge: 2014-03-20 | Disposition: A | Discharge status: Home / Self Care | Payer: Self-pay | Source: Ambulatory Visit | Attending: Obstetrics & Gynecology | Admitting: Obstetrics & Gynecology

## 2014-03-20 DIAGNOSIS — R1031 Right lower quadrant pain: Secondary | ICD-10-CM | POA: Insufficient documentation

## 2014-03-20 DIAGNOSIS — Z90710 Acquired absence of both cervix and uterus: Secondary | ICD-10-CM | POA: Insufficient documentation

## 2014-03-20 MED ORDER — IOHEXOL 300 MG/ML  SOLN
100.0000 mL | Freq: Once | INTRAMUSCULAR | Status: AC | PRN
  Administered 2014-03-20: 100 mL via INTRAVENOUS

## 2014-04-10 ENCOUNTER — Encounter: Payer: Self-pay | Admitting: Obstetrics & Gynecology

## 2014-04-10 ENCOUNTER — Ambulatory Visit (INDEPENDENT_AMBULATORY_CARE_PROVIDER_SITE_OTHER): Payer: Self-pay | Admitting: Obstetrics & Gynecology

## 2014-04-10 VITALS — BP 113/69 | HR 81 | Temp 97.7°F | Wt 142.9 lb

## 2014-04-10 DIAGNOSIS — F32A Depression, unspecified: Secondary | ICD-10-CM

## 2014-04-10 DIAGNOSIS — R1031 Right lower quadrant pain: Secondary | ICD-10-CM

## 2014-04-10 DIAGNOSIS — F329 Major depressive disorder, single episode, unspecified: Secondary | ICD-10-CM

## 2014-04-10 MED ORDER — VENLAFAXINE HCL ER 75 MG PO CP24: 75.0000 mg | ORAL_CAPSULE | Freq: Every day | ORAL | Status: DC

## 2014-04-10 NOTE — Progress Notes (Signed)
Interpreter Julieanne Cotton used in this encounter.

## 2014-04-10 NOTE — Progress Notes (Signed)
Subjective:     Patient ID: Lori Murray, female   DOB: 05/27/1971, 43 y.o.   MRN: [id number]  HPI [id number] Patient's last menstrual period was 07/28/2008. CT scan was normal. Pain may be present 3-4 days then relief for 7 days.   Review of Systems  Constitutional: Negative.   Gastrointestinal: Positive for abdominal pain. Negative for nausea, vomiting, diarrhea, constipation and abdominal distention.  Genitourinary: Negative for dysuria and dyspareunia.  Psychiatric/Behavioral: Positive for sleep disturbance.       Objective:   Physical Exam  Constitutional: She appears well-developed. No distress.  Skin: Skin is warm and dry.  Psychiatric: Her behavior is normal. Thought content normal.  Affect somewhat flat.    Filed Vitals:   04/10/14 1353  BP: 113/69  Pulse: 81  Temp: 97.7 F (36.5 C)    CLINICAL DATA: Right lower quadrant abdominal pain for 8 months. Previous hysterectomy and right oophorectomy.  EXAM: CT ABDOMEN AND PELVIS WITH CONTRAST  TECHNIQUE: Multidetector CT imaging of the abdomen and pelvis was performed using the standard protocol following bolus administration of intravenous contrast.  CONTRAST: 164mL OMNIPAQUE IOHEXOL 300 MG/ML SOLN  COMPARISON: Pelvic ultrasound 10/22/2009, CT abdomen/pelvis 04/20/2009  FINDINGS: Lower chest: Mild respiratory motion at the lung bases degrades imaging. Allowing for this, bowel lung bases are clear. Breast implants are partly visualized.  Hepatobiliary: Liver and gallbladder appear normal. No common duct or intrahepatic ductal dilatation.  Pancreas: Normal  Spleen: Stable 6 mm hypodense oval lesion image 16, statistically most likely a benign entity such as hemangioma or lymphangioma. Otherwise normal with appearance.  Adrenals/Urinary Tract: Adrenal glands are normal. Kidneys appear normal. No hydroureteronephrosis or radiopaque renal or ureteral calculus.  Stomach/Bowel: Normal  appendix. No bowel wall thickening or focal segmental dilatation.  Vascular/Lymphatic: No lymphadenopathy.  Reproductive: Left ovary appears normal. Right ovary and uterus surgically absent.  Other: No free fluid or free air.  Musculoskeletal: No acute osseous abnormality.  IMPRESSION: No acute intra-abdominal or pelvic pathology.   Electronically Signed  By: Conchita Paris M.D.  On: 03/20/2014 09:58        Assessment:     Intermittent RLQ pain neg Gyn w/u, insomnia and depression not treated     Plan:     Restart her Effexor, f/u IM clinic  Woodroe Mode, MD 04/10/2014

## 2014-04-10 NOTE — Patient Instructions (Signed)
Dolor abdominal en las mujeres °(Abdominal Pain, Women) °El dolor abdominal (en el estómago, la pelvis o el vientre) puede tener muchas causas. Es importante que le informe a su médico: °· La ubicación del dolor. °· ¿Viene y se va, o persiste todo el tiempo? °· ¿Hay situaciones que inician el dolor (comer ciertos alimentos, la actividad física)? °· ¿Tiene otros síntomas asociados al dolor (fiebre, náuseas, vómitos, diarrea)? °Todo es de gran ayuda cuando se trata de hallar la causa del dolor. °CAUSAS °· Estómago: Infecciones por virus o bacterias, o úlcera. °· Intestino: Apendicitis (apéndice inflamado), ileitis regional (enfermedad de Crohn), colitis ulcerosa (colon inflamado), síndrome del colon irritable, diverticulitis (inflamación de los divertículos del colon) o cáncer de estómago oo intestino. °· Enfermedades de la vesícula biliar o cálculos. °· Enfermedades renales, cálculos o infecciones en el riñón. °· Infección o cáncer del páncreas. °· Fibromialgia (trastorno doloroso) °· Enfermedades de los órganos femeninos: °¨ Uterus: Útero: fibroma (tumor no canceroso) o infección °¨ Trompas de Falopio: infección o embarazo ectópico °¨ En los ovarios, quistes o tumores. °¨ Adherencias pélvicas (tejido cicatrizal). °¨ Endometriosis (el tejido que cubre el útero se desarrolla en la pelvis y los órganos pélvicos). °¨ Síndrome de congestión pélvica (los órganos femeninos se llenan de sangre antes del periodo menstrual( °¨ Dolor durante el periodo menstrual. °¨ Dolor durante la ovulación (al producir óvulos). °¨ Dolor al usar el DIU (dispositivo intrauterino para el control de la natalidad) °¨ Cáncer en los órganos femeninos. °· Dolor funcional (no está originado en una enfermedad, puede mejorar sin tratamiento). °· Dolor de origen psicológico °· Depresión. °DIAGNÓSTICO °Su médico decidirá la gravedad del dolor a través del examen físico °· Análisis de sangre °· Radiografías °· Ecografías °· TC (tomografía computada, tipo  especial de radiografías). °· IMR (resonancia magnética) °· Cultivos, en el caso una infección °· Colon por enema de bario (se inserta una sustancia de contraste en el intestino grueso para mejorar la observación con rayos X.) °· Colonoscopía (observación del intestino con un tubo luminoso). °· Laparoscopía (examen del interior del abdomen con un tubo que tiene una luz). °· Cirugía exploratoria abdominal mayor (se observa el abdomen realizando una gran incisión). °TRATAMIENTO °El tratamiento dependerá de la causa del problema.  °· Muchos de estos casos pueden controlarse y tratarse en casa. °· Medicamentos de venta libre indicados por el médico. °· Medicamentos con receta. °· Antibióticos, en caso de infección °· Píldoras anticonceptivas, en el caso de períodos dolorosos o dolor al ovular. °· Tratamiento hormonal, para la endometriosis °· Inyecciones para bloqueo nervioso selectivo. °· Fisioterapia. °· Antidepresivos. °· Consejos por parte de un psícólogo o psiquiatra. °· Cirugía mayor o menor. °INSTRUCCIONES PARA EL CUIDADO DOMICILIARIO °· No tome ni administre laxantes a menos que se lo haya indicado su médico. °· Tome analgésicos de venta libre sólo si se lo ha indicado el profesional que lo asiste. No tome aspirina, ya que puede causar molestias en el estómago o hemorragias. °· Consuma una dieta líquida (caldo o agua) según lo indicado por el médico. Progrese lentamente a una dieta blanda, según la tolerancia, si el dolor se relaciona con el estómago o el intestino. °· Tenga un termómetro y tómese la temperatura varias veces al día. °· Haga reposo en la cama y duerma, si esto alivia el dolor. °· Evite las relaciones sexuales, si le producen dolor. °· Evite las situaciones estresantes. °· Cumpla con las visitas y los análisis de control, según las indicaciones de su médico. °· Si el dolor   no se alivia con los medicamentos o la cirugía, puede tratar con: °¨ Acupuntura. °¨ Ejercicios de relajación (yoga,  meditación). °¨ Terapia grupal. °¨ Psicoterapia. °SOLICITE ATENCIÓN MÉDICA SI: °· Nota que ciertos alimentos le producen dolor de estómago. °· El tratamiento indicado para realizar en el hogar no le alivia el dolor. °· Necesita analgésicos más fuertes. °· Quiere que le retiren el DIU. °· Si se siente confundido o desfalleciente. °· Presenta náuseas o vómitos. °· Aparece una erupción cutánea. °· Sufre efectos adversos o una reacción alérgica debido a los medicamentos que toma. °SOLICITE ATENCIÓN MÉDICA DE INMEDIATO SI: °· El dolor persiste o se agrava. °· Tiene fiebre. °· Siente el dolor sólo en algunos sectores del abdomen. Si se localiza en la zona derecha, posiblemente podría tratarse de apendicitis. En un adulto, si se localiza en la región inferior izquierda del abdomen, podría tratarse de colitis o diverticulitis. °· Hay sangre en las heces (deposiciones de color rojo brillante o negro alquitranado), con o sin vómitos. °· Usted presenta sangre en la orina. °· Siente escalofríos con o sin fiebre. °· Se desmaya. °ASEGÚRESE QUE:  °· Comprende estas instrucciones. °· Controlará su enfermedad. °· Solicitará ayuda de inmediato si no mejora o si empeora. °Document Released: 09/11/2008 Document Revised: 08/18/2011 °ExitCare® Patient Information ©2015 [employer]. This information is not intended to replace advice given to you by your health care provider. Make sure you discuss any questions you have with your health care provider. ° °

## 2015-03-11 ENCOUNTER — Encounter: Payer: Self-pay | Admitting: Internal Medicine

## 2015-03-11 ENCOUNTER — Other Ambulatory Visit: Payer: Self-pay | Admitting: Internal Medicine

## 2015-03-11 DIAGNOSIS — R109 Unspecified abdominal pain: Secondary | ICD-10-CM

## 2015-03-12 ENCOUNTER — Encounter: Payer: Self-pay | Admitting: Internal Medicine

## 2015-03-12 ENCOUNTER — Ambulatory Visit (INDEPENDENT_AMBULATORY_CARE_PROVIDER_SITE_OTHER): Payer: Self-pay | Admitting: Internal Medicine

## 2015-03-12 VITALS — BP 125/53 | HR 64 | Temp 98.5°F | Wt 154.3 lb

## 2015-03-12 DIAGNOSIS — D3610 Benign neoplasm of peripheral nerves and autonomic nervous system, unspecified: Secondary | ICD-10-CM

## 2015-03-12 DIAGNOSIS — F329 Major depressive disorder, single episode, unspecified: Secondary | ICD-10-CM

## 2015-03-12 DIAGNOSIS — Z0000 Encounter for general adult medical examination without abnormal findings: Secondary | ICD-10-CM

## 2015-03-12 DIAGNOSIS — F32A Depression, unspecified: Secondary | ICD-10-CM

## 2015-03-12 DIAGNOSIS — R109 Unspecified abdominal pain: Secondary | ICD-10-CM

## 2015-03-12 DIAGNOSIS — F209 Schizophrenia, unspecified: Secondary | ICD-10-CM

## 2015-03-12 MED ORDER — VENLAFAXINE HCL ER 75 MG PO CP24: 75.0000 mg | ORAL_CAPSULE | Freq: Every day | ORAL | Status: DC

## 2015-03-12 NOTE — Patient Instructions (Signed)
Thank you for your visit today.   Please return to the internal medicine clinic as needed.   I have refilled your venlafaxine.   I suggest you follow up with your [facility] for your schwannoma if you have concerns.     Please be sure to bring all of your medications with you to every visit; this includes herbal supplements, vitamins, eye drops, and any over-the-counter medications.   Should you have any questions regarding your medications and/or any new or worsening symptoms, please be sure to call the clinic at [phone].   If you believe that you are suffering from a life threatening condition or one that may result in the loss of limb or function, then you should call 911 and proceed to the nearest Emergency Department.   A healthy lifestyle and preventative care can promote health and wellness.   Maintain regular health, dental, and eye exams.  Eat a healthy diet. Foods like vegetables, fruits, whole grains, low-fat dairy products, and lean protein foods contain the nutrients you need without too many calories. Decrease your intake of foods high in solid fats, added sugars, and salt. Get information about a proper diet from your caregiver, if necessary.  Regular physical exercise is one of the most important things you can do for your health. Most adults should get at least 150 minutes of moderate-intensity exercise (any activity that increases your heart rate and causes you to sweat) each week. In addition, most adults need muscle-strengthening exercises on 2 or more days a week.   Maintain a healthy weight. The body mass index (BMI) is a screening tool to identify possible weight problems. It provides an estimate of body fat based on height and weight. Your caregiver can help determine your BMI, and can help you achieve or maintain a healthy weight. For adults 20 years and older:  A BMI below 18.5 is considered underweight.  A BMI of 18.5 to 24.9 is normal.  A BMI of 25 to  29.9 is considered overweight.  A BMI of 30 and above is considered obese.

## 2015-03-12 NOTE — Progress Notes (Signed)
Patient ID: Lori Murray, female   DOB: 1970/07/16, 43 y.o.   MRN: [id number]     Subjective:   Patient ID: Lori Murray female    DOB: 08-13-1970 44 y.o.    MRN: [id number] Health Maintenance Due: Health Maintenance Due  Topic Date Due  . INFLUENZA VACCINE  01/08/2015    _________________________________________________  HPI: [name] is a 44 y.o. female here for a routine visit.  Pt has a PMH outlined below.  Please see problem-based charting assessment and plan for further status of patient's chronic medical problems addressed at today's visit.  PMH: Past Medical History  Diagnosis Date  . History of tension headache   . History of tobacco abuse   . Gardnerella vaginalis infection     recurrent  . Metrorrhagia     h/o treated with OCP.  Marland Kitchen Depression   . Anemia     iron deficiency  . Fibroids     s/p TAH- right salpingo- oophorectomy in 2010  . 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 followed at [facility] center; LUL ptosis 2/2 Horner's; f/u with Dr. Levan Hurst  . Schwannoma 12/14/2008    Left sided benign Schwanoma( retropharyngeal space ) s/p resection in 01/11.       Medications: Current Outpatient Prescriptions on File Prior to Visit  Medication Sig Dispense Refill  . Artificial Saliva (BIOTENE MOISTURIZING MOUTH) SOLN Use as directed in the mouth or throat.      . Multiple Vitamin (MULTIVITAMIN) tablet Take 1 tablet by mouth daily.    Vladimir Faster Glycol-Propyl Glycol (SYSTANE ULTRA) 0.4-0.3 % SOLN Apply to eye.       No current facility-administered medications on file prior to visit.    Allergies: No Known Allergies  FH: Family History  Problem Relation Age of Onset  . Diabetes Neg Hx   . Hypertension Neg Hx   . Depression Neg Hx     SH: Social History   Social History  . Marital Status: Legally Separated    Spouse Name: N/A  . Number of Children: N/A  . Years of Education: N/A   Social  History Main Topics  . Smoking status: Former Smoker -- 0.10 packs/day    Types: Cigarettes  . Smokeless tobacco: Never Used     Comment: stopped 1 month ago  . Alcohol Use: No  . Drug Use: No  . Sexual Activity: Yes    Birth Control/ Protection: None   Other Topics Concern  . None   Social History Narrative   ** Merged History Encounter **       Regular exercise- yes.    Review of Systems: Constitutional: Negative for fever, chills and weight loss.  Eyes: Negative for blurred vision.  Respiratory: Negative for cough and shortness of breath.  Cardiovascular: Negative for chest pain, palpitations and leg swelling.  Gastrointestinal: Negative for nausea, vomiting, abdominal pain, diarrhea, constipation and blood in stool.  Genitourinary: Negative for dysuria, urgency and frequency.  Musculoskeletal: Negative for myalgias and back pain.  Neurological: Negative for dizziness, weakness and headaches.     Objective:   Vital Signs: Filed Vitals:   03/12/15 1552  BP: 125/53  Pulse: 64  Temp: 98.5 F (36.9 C)  TempSrc: Oral  Weight: 154 lb 4.8 oz (69.99 kg)  SpO2: 99%      BP Readings from Last 3 Encounters:  03/12/15 125/53  04/10/14 113/69  03/13/14 117/70    Physical Exam: Constitutional: Vital signs  reviewed.  Patient is in NAD and cooperative with exam.  Head: Normocephalic and atraumatic. Eyes: EOMI, conjunctivae nl, no scleral icterus.  Neck: Supple. Cardiovascular: RRR, no MRG. Pulmonary/Chest: normal effort, CTAB, no wheezes, rales, or rhonchi. Abdominal: Soft. NT/ND +BS. Neurological: A&O x3, cranial nerves II-XII are grossly intact, moving all extremities. Extremities: 2+DP b/l; no pitting edema. Skin: Warm, dry and intact.    Assessment & Plan:   Assessment and plan was discussed and formulated with my attending.

## 2015-03-18 NOTE — Assessment & Plan Note (Signed)
Pt states she feels better when she takes effexor but has been out for about 1 month.  She denies any current symptoms of depression today but is requesting a refill of effexor.  She is not interested in seeing psychiatry or counseling.   -refilled effexor

## 2015-03-18 NOTE — Assessment & Plan Note (Signed)
Pt is not on any tx for schizophrenia and does not wish to see psychiatry or engage in counseling.   -cont to monitor

## 2015-03-18 NOTE — Assessment & Plan Note (Signed)
resolved 

## 2015-03-18 NOTE — Assessment & Plan Note (Signed)
Pt declines flu shot for financial concerns.   -had hysterectomy for ??? and states was told she does not need a pap smear

## 2015-03-18 NOTE — Assessment & Plan Note (Signed)
Pt is concerned about some facial swelling especially around the left cheek area that she reports comes and goes.  She is unable to tell me how long she has noticed this swelling.  Denies any other symptoms of fever/chills, weight loss, etc.  She is concerned that it is related to her previous history of schwannoma which was benign resection completed 2011 at [facility].  I did not appreciate any facial swelling but reported mild tenderness to palpation.   -advised her to f/u with [facility] if she is concerned -advised to use a cold compress if she feels there is swelling (I did not appreciate any swelling on my exam)

## 2015-03-19 NOTE — Progress Notes (Signed)
Internal Medicine Clinic Attending  Case discussed with Dr. Gill soon after the resident saw the patient.  We reviewed the resident's history and exam and pertinent patient test results.  I agree with the assessment, diagnosis, and plan of care documented in the resident's note.  

## 2015-04-11 ENCOUNTER — Ambulatory Visit (INDEPENDENT_AMBULATORY_CARE_PROVIDER_SITE_OTHER): Payer: Self-pay | Admitting: Internal Medicine

## 2015-04-11 ENCOUNTER — Encounter: Payer: Self-pay | Admitting: Internal Medicine

## 2015-04-11 VITALS — BP 124/80 | HR 72 | Temp 97.9°F | Ht 62.0 in | Wt 155.4 lb

## 2015-04-11 DIAGNOSIS — Z87891 Personal history of nicotine dependence: Secondary | ICD-10-CM

## 2015-04-11 DIAGNOSIS — J069 Acute upper respiratory infection, unspecified: Secondary | ICD-10-CM

## 2015-04-11 DIAGNOSIS — B9789 Other viral agents as the cause of diseases classified elsewhere: Principal | ICD-10-CM

## 2015-04-11 MED ORDER — FLUTICASONE PROPIONATE 50 MCG/ACT NA SUSP: 2.0000 | Freq: Every day | NASAL | Status: DC

## 2015-04-11 MED ORDER — GUAIFENESIN-CODEINE 100-10 MG/5ML PO SYRP: 5.0000 mL | ORAL_SOLUTION | Freq: Three times a day (TID) | ORAL | Status: DC | PRN

## 2015-04-11 MED ORDER — OXYMETAZOLINE HCL 0.05 % NA SOLN: 1.0000 | Freq: Two times a day (BID) | NASAL | Status: AC

## 2015-04-11 MED ORDER — BENZONATATE 100 MG PO CAPS: 100.0000 mg | ORAL_CAPSULE | Freq: Three times a day (TID) | ORAL | Status: DC | PRN

## 2015-04-11 MED ORDER — GUAIFENESIN ER 600 MG PO TB12: 600.0000 mg | ORAL_TABLET | Freq: Two times a day (BID) | ORAL | Status: DC

## 2015-04-11 NOTE — Patient Instructions (Signed)
TAKE THE FOLLOWING MEDICATIONS:  -TESSALON PEARLS THREE TIMES A DAY FOR COUGH  -MUCINEX TWICE A DAY FOR CONGESTION  -FLONASE (2 SPRAYS EACH NOSTRIL) ONCE DAY  -TWO HOURS BEFORE OR AFTER FLONASE USE AFRIN   -AFRIN CAN BE USED A TWICE A DAY FOR 3 DAYS ONLY!!!  -USE ROBITUSSIN AC AT NIGHT FOR COUGH (THIS WILL MAKE YOU TIRED)

## 2015-04-11 NOTE — Assessment & Plan Note (Signed)
Patient presents with a one week history of sinus pressure, bilateral ear pain, nasal congestion, sore throat, non-productive cough and subjective fever and chills. She states her daughter was recently sick and she thinks she got it from her. She has being trying Robitussin with little relief. Her worst symptoms are the congestion and cough. She denies shortness of breath, nausea, vomiting or diarrhea.   Plan: -Mucinex 600 mg BID -Robitussin AC QHS prn cough -Flonase 2 sprays QD -Afrin BID for 3 days only -Tessalon pearls TID prn cough -Supportive treatment with rest and hydration -Work note for this week -Return if symptoms worsen or fail to improve

## 2015-04-11 NOTE — Progress Notes (Signed)
Subjective:    Patient ID: Lori Murray, female    DOB: 01-Sep-1970, 44 y.o.   MRN: [id number]  HPI Lori Murray is a 44 y.o. female with PMHx of Depression, Anemia, tobacco abuse who presents to the clinic for complaint of non-productive cough. Please see A&P for the status of the patient's chronic medical problems.   Patient presents with a one week history of sinus pressure, bilateral ear pain, nasal congestion, sore throat, non-productive cough and subjective fever and chills. She states her daughter was recently sick and she thinks she got it from her. She has being trying Robitussin with little relief. Her worst symptoms are the congestion and cough. She denies shortness of breath, nausea, vomiting or diarrhea.   Past Medical History  Diagnosis Date  . History of tension headache   . History of tobacco abuse   . Gardnerella vaginalis infection     recurrent  . Metrorrhagia     h/o treated with OCP.  Marland Kitchen Depression   . Anemia     iron deficiency  . Fibroids     s/p TAH- right salpingo- oophorectomy in 2010  . 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 followed at [facility] center; LUL ptosis 2/2 Horner's; f/u with Dr. Levan Hurst  . Schwannoma 12/14/2008    Left sided benign Schwanoma( retropharyngeal space ) s/p resection in 01/11.       Outpatient Encounter Prescriptions as of 04/11/2015  Medication Sig  . Artificial Saliva (BIOTENE MOISTURIZING MOUTH) SOLN Use as directed in the mouth or throat.    . Multiple Vitamin (MULTIVITAMIN) tablet Take 1 tablet by mouth daily.  Vladimir Faster Glycol-Propyl Glycol (SYSTANE ULTRA) 0.4-0.3 % SOLN Apply to eye.    . venlafaxine XR (EFFEXOR XR) 75 MG 24 hr capsule Take 1 capsule (75 mg total) by mouth daily.   No facility-administered encounter medications on file as of 04/11/2015.    Family History  Problem Relation Age of Onset  . Diabetes Neg Hx   . Hypertension Neg Hx   . Depression Neg Hx      Social History   Social History  . Marital Status: Legally Separated    Spouse Name: N/A  . Number of Children: N/A  . Years of Education: N/A   Occupational History  . Not on file.   Social History Main Topics  . Smoking status: Former Smoker -- 0.10 packs/day    Types: Cigarettes  . Smokeless tobacco: Never Used     Comment: stopped 1 month ago  . Alcohol Use: No  . Drug Use: No  . Sexual Activity: Yes    Birth Control/ Protection: None   Other Topics Concern  . Not on file   Social History Narrative   ** Merged History Encounter **       Regular exercise- yes.   Review of Systems General: Admits to fever, chills, fatigue, and diaphoresis.  HEENT: Admits to nasal congestion, sinus pressure, sore throat.  Respiratory: Admits to non-productive cough. Denies SOB, DOE, chest tightness, and wheezing.   Cardiovascular: Denies chest pain and palpitations.  Gastrointestinal: Denies nausea, vomiting, abdominal pain, diarrhea Musculoskeletal: Denies myalgias Skin: Denies pallor, rash and wounds.  Neurological: Denies dizziness, weakness, lightheadedness     Objective:   Physical Exam Filed Vitals:   04/11/15 0917  BP: 124/80  Pulse: 72  Temp: 97.9 F (36.6 C)  TempSrc: Oral  Height: 5\' 2"  (1.575 m)  Weight: 155 lb 6.4  oz (70.489 kg)  SpO2: 100%   General: Vital signs reviewed.  Patient is well-developed and well-nourished, in no acute distress and cooperative with exam.  HEENT: Normocephalic and atraumatic. EOMI, conjunctivae normal, no conjunctival injection. No cervical lymphadenopathy. +Fluid behind right TM. Normal left TM. +Erythematous, edematous nasal turbinates, erythematous posterior oropharynx without exudates.  Cardiovascular: RRR, S1 normal, S2 normal, no murmurs, gallops, or rubs. Pulmonary/Chest: Clear to auscultation bilaterally, no wheezes, rales, or rhonchi. Abdominal: Soft, non-tender, non-distended, BS + Extremities: No lower extremity edema  bilaterally Skin: Warm, dry and intact. No rashes or erythema. Psychiatric: Normal mood and affect.      Assessment & Plan:   Please see problem based assessment and plan.

## 2015-04-12 NOTE — Progress Notes (Signed)
Internal Medicine Clinic Attending  Case discussed with Dr. Richardson soon after the resident saw the patient.  We reviewed the resident's history and exam and pertinent patient test results.  I agree with the assessment, diagnosis, and plan of care documented in the resident's note. 

## 2015-10-22 ENCOUNTER — Encounter: Payer: Self-pay | Admitting: Internal Medicine

## 2015-10-22 ENCOUNTER — Ambulatory Visit (INDEPENDENT_AMBULATORY_CARE_PROVIDER_SITE_OTHER): Payer: Self-pay | Admitting: Internal Medicine

## 2015-10-22 VITALS — BP 118/69 | HR 67 | Temp 97.9°F | Resp 18 | Ht 66.0 in | Wt 150.7 lb

## 2015-10-22 DIAGNOSIS — N3090 Cystitis, unspecified without hematuria: Secondary | ICD-10-CM

## 2015-10-22 LAB — POCT URINALYSIS DIPSTICK
BILIRUBIN UA: NEGATIVE
GLUCOSE UA: NEGATIVE
Nitrite, UA: POSITIVE
PROTEIN UA: 100
SPEC GRAV UA: 1.02
Urobilinogen, UA: 1
pH, UA: 5.5

## 2015-10-22 MED ORDER — NITROFURANTOIN MONOHYD MACRO 100 MG PO CAPS: 100.0000 mg | ORAL_CAPSULE | Freq: Two times a day (BID) | ORAL | Status: DC

## 2015-10-22 NOTE — Progress Notes (Signed)
Case discussed with Dr. Gill soon after the resident saw the patient.  We reviewed the resident's history and exam and pertinent patient test results.  I agree with the assessment, diagnosis, and plan of care documented in the resident's note. 

## 2015-10-22 NOTE — Progress Notes (Signed)
Patient ID: Lori Murray, female   DOB: [birth year], 45 y.o.   MRN: [id number]      Subjective:   Patient ID: Lori Murray female    DOB: 1971-02-24 45 y.o.    MRN: [id number] Health Maintenance Due: There are no preventive care reminders to display for this patient.  _________________________________________________  HPI: [name] is a 45 y.o. female here for dysuria.  Pt has a PMH outlined below.  Please see problem-based charting assessment and plan for further status of patient's chronic medical problems addressed at today's visit.  PMH: Past Medical History  Diagnosis Date  . History of tension headache   . History of tobacco abuse   . Gardnerella vaginalis infection     recurrent  . Metrorrhagia     h/o treated with OCP.  Marland Kitchen Depression   . Anemia     iron deficiency  . Fibroids     s/p TAH- right salpingo- oophorectomy in 2010  . 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 followed at [facility] center; LUL ptosis 2/2 Horner's; f/u with Dr. Levan Hurst  . Schwannoma 12/14/2008    Left sided benign Schwanoma( retropharyngeal space ) s/p resection in 01/11.       Medications: Current Outpatient Prescriptions on File Prior to Visit  Medication Sig Dispense Refill  . Artificial Saliva (BIOTENE MOISTURIZING MOUTH) SOLN Use as directed in the mouth or throat.      . benzonatate (TESSALON) 100 MG capsule Take 1 capsule (100 mg total) by mouth 3 (three) times daily as needed for cough. 20 capsule 0  . fluticasone (FLONASE) 50 MCG/ACT nasal spray Place 2 sprays into both nostrils daily. 16 g 2  . guaiFENesin (MUCINEX) 600 MG 12 hr tablet Take 1 tablet (600 mg total) by mouth 2 (two) times daily. 14 tablet 0  . guaiFENesin-codeine (ROBITUSSIN AC) 100-10 MG/5ML syrup Take 5 mLs by mouth 3 (three) times daily as needed for cough. 120 mL 0  . Multiple Vitamin (MULTIVITAMIN) tablet Take 1 tablet by mouth daily.    Vladimir Faster  Glycol-Propyl Glycol (SYSTANE ULTRA) 0.4-0.3 % SOLN Apply to eye.      . venlafaxine XR (EFFEXOR XR) 75 MG 24 hr capsule Take 1 capsule (75 mg total) by mouth daily. 30 capsule 6   No current facility-administered medications on file prior to visit.    Allergies: No Known Allergies  FH: Family History  Problem Relation Age of Onset  . Diabetes Neg Hx   . Hypertension Neg Hx   . Depression Neg Hx     SH: Social History   Social History  . Marital Status: Legally Separated    Spouse Name: N/A  . Number of Children: N/A  . Years of Education: N/A   Social History Main Topics  . Smoking status: Former Smoker -- 0.10 packs/day    Types: Cigarettes  . Smokeless tobacco: Never Used     Comment: stopped 1 month ago  . Alcohol Use: No  . Drug Use: No  . Sexual Activity: Yes    Birth Control/ Protection: None   Other Topics Concern  . Not on file   Social History Narrative   ** Merged History Encounter **       Regular exercise- yes.    Review of Systems: Constitutional: Negative for fever, +chills.  Respiratory: Negative shortness of breath.  Cardiovascular: Negative for chest pain.  Gastrointestinal: Negative for nausea, vomiting, +suprapubic pain, +diarrhea.  Musculoskeletal: +back pain.    Objective:   Vital Signs: Filed Vitals:   10/22/15 1403  BP: 118/69  Pulse: 67  Temp: 97.9 F (36.6 C)  TempSrc: Oral  Resp: 18  Height: 5\' 6"  (1.676 m)  Weight: 150 lb 11.2 oz (68.357 kg)  SpO2: 98%      BP Readings from Last 3 Encounters:  10/22/15 118/69  04/11/15 124/80  03/12/15 125/53    Physical Exam: Constitutional: Vital signs reviewed.  Patient is in NAD and cooperative with exam.  Head: Normocephalic and atraumatic. Eyes: EOMI, conjunctivae nl, no scleral icterus.  Cardiovascular: RRR, no MRG. Pulmonary/Chest: Normal effort, CTAB, no wheezes, rales, or rhonchi. Abdominal: Soft. Suprapubic tenderness without rebound or guarding.   +BS. Neurological: A&O x3, cranial nerves II-XII are grossly intact, moving all extremities. Extremities: No LE edema. Skin: Warm, dry and intact.    Assessment & Plan:   Assessment and plan was discussed and formulated with my attending.

## 2015-10-22 NOTE — Addendum Note (Signed)
Addended by: Alinda Dooms A on: 10/22/2015 04:14 PM   Modules accepted: Miquel Dunn

## 2015-10-22 NOTE — Patient Instructions (Signed)
Thank you for your visit today.   Please return to the internal medicine clinic as needed or if your symptoms worsen.     I have made the following additions/changes to your medications:  You have a urinary infection and I have given you an antibiotic, macrobid.  Please take this every 12 hours for 5 days.    Please be sure to bring all of your medications with you to every visit; this includes herbal supplements, vitamins, eye drops, and any over-the-counter medications.   Should you have any questions regarding your medications and/or any new or worsening symptoms, please be sure to call the clinic at [phone].   If you believe that you are suffering from a life threatening condition or one that may result in the loss of limb or function, then you should call 911 and proceed to the nearest Emergency Department.   Infeccin urinaria  (Urinary Tract Infection)  La infeccin urinaria puede ocurrir en Clinical cytogeneticist del tracto urinario. El tracto urinario es un sistema de drenaje del cuerpo por el que se eliminan los desechos y el exceso de [place]. El tracto urinario est formado por dos riones, dos urteres, la vejiga y Geologist, engineering. Los riones son rganos que tienen forma de frijol. Cada rin tiene aproximadamente el tamao del puo. Estn situados debajo de las [place], uno a cada lado de la columna vertebral CAUSAS  La causa de la infeccin son los microbios, que son organismos microscpicos, que incluyen hongos, virus, y bacterias. Estos organismos son tan pequeos que slo pueden verse a travs del microscopio. Las bacterias son los microorganismos que ms comnmente causan infecciones urinarias.  SNTOMAS  Los sntomas pueden variar segn la edad y el sexo del paciente y por la ubicacin de la infeccin. Los sntomas en las mujeres jvenes incluyen la necesidad frecuente e intensa de orinar y una sensacin dolorosa de ardor en la vejiga o en la uretra durante la miccin. Las mujeres y  los hombres mayores podrn sentir cansancio, temblores y debilidad y Arts development officer musculares y Social research officer, government abdominal. Si tiene [place], puede significar que la infeccin est en los riones. Otros sntomas son dolor en la espalda o en los lados debajo de las [place], nuseas y vmitos.  DIAGNSTICO  Para diagnosticar una infeccin urinaria, el mdico le preguntar acerca de sus sntomas. [employer] una [place] de [place]. La muestra de orina se analiza para Hydrographic surveyor bacterias y glbulos blancos de Herbalist. Los glbulos blancos se forman en el organismo para ayudar a Radio broadcast assistant las infecciones.  TRATAMIENTO  Por lo general, las infecciones urinarias pueden tratarse con medicamentos. Debido a que la [employer] de las infecciones son causadas por bacterias, por lo general pueden tratarse con antibiticos. La eleccin del antibitico y la duracin del tratamiento depender de sus sntomas y el tipo de bacteria causante de la infeccin.  INSTRUCCIONES PARA EL CUIDADO EN EL HOGAR   Si le recetaron antibiticos, tmelos exactamente como su mdico le indique. Termine el medicamento aunque se sienta mejor despus de haber tomado slo algunos.  Beba gran cantidad de lquido para mantener la orina de tono claro o color amarillo plido.  Evite la cafena, el t y las bebidas gaseosas. Estas sustancias irritan la vejiga.  Vaciar la vejiga con frecuencia. Evite retener la orina durante largos perodos.  Vace la vejiga antes y despus de Clinical biochemist.  Despus de mover el intestino, las mujeres deben higienizarse la regin perineal desde adelante hacia atrs. Use slo un  papel tissue por vez. SOLICITE ATENCIN MDICA SI:   Siente dolor en la espalda.  Le sube la fiebre.  Los sntomas no mejoran luego de 3 das. SOLICITE ATENCIN MDICA DE INMEDIATO SI:   Siente dolor intenso en la espalda o en la zona inferior del abdomen.  Comienza a sentir escalofros.  Tiene nuseas o  vmitos.  Tiene una sensacin continua de quemazn o molestias al [employer]. ASEGRESE DE QUE:   Comprende estas instrucciones.  Controlar su enfermedad.  Solicitar ayuda de inmediato si no mejora o empeora.   Esta informacin no tiene Marine scientist el consejo del mdico. Asegrese de hacerle al mdico cualquier pregunta que tenga.   Document Released: 03/05/2005 Document Revised: 02/18/2012 Elsevier Interactive Patient Education [employer].

## 2015-10-22 NOTE — Assessment & Plan Note (Addendum)
Pt presents with UTI symptoms since last week.  Stated started having watery diarrhea and took some pepto bismol which helped.  Had only 1 episode of diarrhea today.  No fever but reports some chills.  No sick contacts.  No recent abx use.  She is eating and drinking OK. She subsequently began having dysuria and frequency along with dyspareunia.  No vaginal discharge.  Had a hysterectomy due to fibroids years ago?  Has taken AZO for the past couple of days which has helped.  Has chronic LBP.  Denies any blood in the urine.  No new sexual partners.  Is unsure if she has ever had an STI.  UA with +nitrites and leukocytes.  No allergies to abx.  -will prescribe macrobid x 5 days -send urine for culture  -f/u PRN

## 2015-10-24 LAB — URINE CULTURE

## 2015-11-16 ENCOUNTER — Encounter: Payer: Self-pay | Admitting: Obstetrics & Gynecology

## 2015-11-16 ENCOUNTER — Ambulatory Visit (INDEPENDENT_AMBULATORY_CARE_PROVIDER_SITE_OTHER): Payer: BLUE CROSS/BLUE SHIELD | Admitting: Obstetrics & Gynecology

## 2015-11-16 VITALS — BP 120/75 | HR 62 | Ht 64.0 in | Wt 150.0 lb

## 2015-11-16 DIAGNOSIS — N898 Other specified noninflammatory disorders of vagina: Secondary | ICD-10-CM | POA: Diagnosis not present

## 2015-11-16 DIAGNOSIS — R350 Frequency of micturition: Secondary | ICD-10-CM | POA: Diagnosis not present

## 2015-11-16 DIAGNOSIS — Z1231 Encounter for screening mammogram for malignant neoplasm of breast: Secondary | ICD-10-CM

## 2015-11-16 DIAGNOSIS — Z01419 Encounter for gynecological examination (general) (routine) without abnormal findings: Secondary | ICD-10-CM

## 2015-11-16 LAB — POCT URINALYSIS DIP (DEVICE)
Bilirubin Urine: NEGATIVE
GLUCOSE, UA: NEGATIVE mg/dL
KETONES UR: NEGATIVE mg/dL
Leukocytes, UA: NEGATIVE
Nitrite: NEGATIVE
PROTEIN: NEGATIVE mg/dL
SPECIFIC GRAVITY, URINE: 1.02 (ref 1.005–1.030)
Urobilinogen, UA: 0.2 mg/dL (ref 0.0–1.0)
pH: 5.5 (ref 5.0–8.0)

## 2015-11-16 MED ORDER — SULFAMETHOXAZOLE-TRIMETHOPRIM 800-160 MG PO TABS: 1.0000 | ORAL_TABLET | Freq: Two times a day (BID) | ORAL | Status: DC

## 2015-11-16 NOTE — Progress Notes (Signed)
Video interpreter [phone]

## 2015-11-16 NOTE — Progress Notes (Signed)
   Subjective:    Patient ID: Lori Murray, female    DOB: 1971/05/11, 46 y.o.   MRN: [id number]  HPI 45 yo H lady here with the complaints of polyuria, dysuria, and a vaginal discharge. She has had several confirmed Ecoli UTIs. She reports that she drinks 3 liters of water daily and wipes from front to back.   Review of Systems     Objective:   Physical Exam [id number] Breathing and conversing normally (with interpretor) EG, vagina - normal, almost no discharge seen No CVAT       Assessment & Plan:  Probably UTI- check culture, treat with bactrim for 7 days If this recurs, then rec referral to urology Wet prep sent

## 2015-11-17 LAB — WET PREP, GENITAL
Trich, Wet Prep: NONE SEEN
WBC WET PREP: NONE SEEN
Yeast Wet Prep HPF POC: NONE SEEN

## 2015-11-18 LAB — URINE CULTURE
COLONY COUNT: NO GROWTH
ORGANISM ID, BACTERIA: NO GROWTH

## 2015-11-19 ENCOUNTER — Encounter: Payer: Self-pay | Admitting: *Deleted

## 2015-11-22 ENCOUNTER — Telehealth: Payer: Self-pay | Admitting: General Practice

## 2015-11-22 DIAGNOSIS — N760 Acute vaginitis: Secondary | ICD-10-CM

## 2015-11-22 DIAGNOSIS — B9689 Other specified bacterial agents as the cause of diseases classified elsewhere: Secondary | ICD-10-CM

## 2015-11-22 MED ORDER — METRONIDAZOLE 500 MG PO TABS: 500.0000 mg | ORAL_TABLET | Freq: Two times a day (BID) | ORAL | Status: DC

## 2015-11-22 NOTE — Telephone Encounter (Signed)
Telephone call to patient regarding wet prep results with [facility]. No answer- left message to call us back at the clinics for non urgent results.

## 2015-11-28 NOTE — Telephone Encounter (Signed)
Called patient with pacific interpreter [phone], no answer- left message to call us back at the clinics for results

## 2015-11-30 NOTE — Telephone Encounter (Signed)
Patient returned call, told front desk it is ok to give results in [place]. I spoke with patient and informed her of test results BV, she can pick up her prescription at her pharmacy. Patient voiced understanding.

## 2015-12-03 ENCOUNTER — Ambulatory Visit
Admission: RE | Admit: 2015-12-03 | Discharge: 2015-12-03 | Disposition: A | Discharge status: Home / Self Care | Payer: BLUE CROSS/BLUE SHIELD | Source: Ambulatory Visit | Attending: Obstetrics & Gynecology | Admitting: Obstetrics & Gynecology

## 2015-12-03 ENCOUNTER — Other Ambulatory Visit: Payer: Self-pay | Admitting: Obstetrics & Gynecology

## 2015-12-03 DIAGNOSIS — Z1231 Encounter for screening mammogram for malignant neoplasm of breast: Secondary | ICD-10-CM

## 2016-09-24 ENCOUNTER — Ambulatory Visit (INDEPENDENT_AMBULATORY_CARE_PROVIDER_SITE_OTHER): Payer: BLUE CROSS/BLUE SHIELD | Admitting: Pulmonary Disease

## 2016-09-24 ENCOUNTER — Encounter (INDEPENDENT_AMBULATORY_CARE_PROVIDER_SITE_OTHER): Payer: Self-pay

## 2016-09-24 DIAGNOSIS — F339 Major depressive disorder, recurrent, unspecified: Secondary | ICD-10-CM

## 2016-09-24 DIAGNOSIS — B9689 Other specified bacterial agents as the cause of diseases classified elsewhere: Secondary | ICD-10-CM

## 2016-09-24 DIAGNOSIS — J019 Acute sinusitis, unspecified: Secondary | ICD-10-CM | POA: Diagnosis not present

## 2016-09-24 DIAGNOSIS — J0100 Acute maxillary sinusitis, unspecified: Secondary | ICD-10-CM

## 2016-09-24 DIAGNOSIS — Z87891 Personal history of nicotine dependence: Secondary | ICD-10-CM

## 2016-09-24 MED ORDER — BENZONATATE 100 MG PO CAPS: 100.0000 mg | ORAL_CAPSULE | Freq: Three times a day (TID) | ORAL | 0 refills | Status: DC | PRN

## 2016-09-24 MED ORDER — AMOXICILLIN-POT CLAVULANATE 875-125 MG PO TABS: 1.0000 | ORAL_TABLET | Freq: Two times a day (BID) | ORAL | 0 refills | Status: AC

## 2016-09-24 MED ORDER — VENLAFAXINE HCL ER 75 MG PO CP24: 75.0000 mg | ORAL_CAPSULE | Freq: Every day | ORAL | 1 refills | Status: DC

## 2016-09-24 NOTE — Progress Notes (Signed)
   CC: cough  HPI:  [name] is a 46 y.o. woman with history as noted below here for evaluation of cough.  She has been having a cough. Green mucous. No hemoptysis. Really started feeling something in her throat about 1 week ago. Got worse on Sunday. Having post nasal drip. Watery diarrhea this morning x 1. None since then. She has been taking Mucinex. She has chills but no fevers. She has sinus pain.   She feels depressed for the past 3 weeks. Feeling tired. Not eating well. Cries easily. Used to be on venlafaxine. Had tried to stop taking. It does help when she is on it.    Past Medical History:  Diagnosis Date  . Anemia    iron deficiency  . Depression   . Fibroids    s/p TAH- right salpingo- oophorectomy in 2010  . Gardnerella vaginalis infection    recurrent  . History of tension headache   . History of tobacco abuse   . Horner's syndrome    dry eye syndrome: OS>OD 2/2 horner's syndrome. followed at [facility] center; LUL ptosis 2/2 Horner's; f/u with Dr. Levan Hurst  . Metrorrhagia    h/o treated with OCP.  Marland Kitchen Schwannoma 12/14/2008   Left sided benign Schwanoma( retropharyngeal space ) s/p resection in 01/11.       Review of Systems:   No nausea/vomiting No dysuria  Physical Exam:  Vitals:   09/24/16 1530  BP: 116/65  Pulse: 63  Temp: 98.2 F (36.8 C)  TempSrc: Oral  SpO2: 99%  Weight: 145 lb 6.4 oz (66 kg)  Height: 5\' 4"  (1.626 m)   General Apperance: NAD HEENT: Normocephalic, atraumatic, anicteric sclera Neck: Supple, trachea midline Lungs: Clear to auscultation bilaterally. No wheezes, rhonchi or rales. Breathing comfortably Heart: Regular rate and rhythm, no murmur/rub/gallop Abdomen: Soft, nontender, nondistended, no rebound/guarding Extremities: Warm and well perfused, no edema Skin: No rashes or lesions Neurologic: Alert and interactive. No gross deficits.  Assessment & Plan:   See Encounters Tab for problem based charting.  Patient  discussed with Dr. Lynnae January

## 2016-09-24 NOTE — Patient Instructions (Signed)
Take your antibiotic twice a day for 5 days Follow up in 6 weeks

## 2016-09-25 DIAGNOSIS — J019 Acute sinusitis, unspecified: Secondary | ICD-10-CM | POA: Insufficient documentation

## 2016-09-25 NOTE — Progress Notes (Signed)
Internal Medicine Clinic Attending  Case discussed with Dr. Krall at the time of the visit.  We reviewed the resident's history and exam and pertinent patient test results.  I agree with the assessment, diagnosis, and plan of care documented in the resident's note.  

## 2016-09-25 NOTE — Assessment & Plan Note (Addendum)
Assessment: 7 days of cough productive of green mucous and post nasal drip with sinus pressure.   Plan: Augmentin 875-125mg  BID for 5 days Return precautions discussed

## 2016-09-25 NOTE — Assessment & Plan Note (Signed)
Has had recurrence of her depression and would like to placed back on Effexor. It works when she takes it. Refilled Effexor 75mg  XR. Follow up in 6 weeks for reevaluation.

## 2016-10-24 ENCOUNTER — Ambulatory Visit (HOSPITAL_COMMUNITY)
Admission: EM | Admit: 2016-10-24 | Discharge: 2016-10-24 | Disposition: A | Discharge status: Home / Self Care | Payer: BLUE CROSS/BLUE SHIELD | Attending: Internal Medicine | Admitting: Internal Medicine

## 2016-10-24 ENCOUNTER — Emergency Department (HOSPITAL_COMMUNITY)
Admission: EM | Admit: 2016-10-24 | Discharge: 2016-10-25 | Disposition: A | Discharge status: Home / Self Care | Payer: BLUE CROSS/BLUE SHIELD | Attending: Emergency Medicine | Admitting: Emergency Medicine

## 2016-10-24 ENCOUNTER — Encounter (HOSPITAL_COMMUNITY): Payer: Self-pay

## 2016-10-24 ENCOUNTER — Encounter (HOSPITAL_COMMUNITY): Payer: Self-pay | Admitting: Emergency Medicine

## 2016-10-24 DIAGNOSIS — K921 Melena: Secondary | ICD-10-CM

## 2016-10-24 DIAGNOSIS — Z87891 Personal history of nicotine dependence: Secondary | ICD-10-CM | POA: Insufficient documentation

## 2016-10-24 DIAGNOSIS — Z79899 Other long term (current) drug therapy: Secondary | ICD-10-CM | POA: Insufficient documentation

## 2016-10-24 DIAGNOSIS — K625 Hemorrhage of anus and rectum: Secondary | ICD-10-CM | POA: Diagnosis present

## 2016-10-24 DIAGNOSIS — K529 Noninfective gastroenteritis and colitis, unspecified: Secondary | ICD-10-CM | POA: Diagnosis not present

## 2016-10-24 LAB — COMPREHENSIVE METABOLIC PANEL
ALBUMIN: 3.8 g/dL (ref 3.5–5.0)
ALK PHOS: 100 U/L (ref 38–126)
ALT: 16 U/L (ref 14–54)
AST: 23 U/L (ref 15–41)
Anion gap: 8 (ref 5–15)
BILIRUBIN TOTAL: 0.6 mg/dL (ref 0.3–1.2)
BUN: 13 mg/dL (ref 6–20)
CALCIUM: 8.9 mg/dL (ref 8.9–10.3)
CO2: 23 mmol/L (ref 22–32)
CREATININE: 0.66 mg/dL (ref 0.44–1.00)
Chloride: 106 mmol/L (ref 101–111)
Glucose, Bld: 84 mg/dL (ref 65–99)
Potassium: 3.5 mmol/L (ref 3.5–5.1)
Sodium: 137 mmol/L (ref 135–145)
TOTAL PROTEIN: 7.6 g/dL (ref 6.5–8.1)

## 2016-10-24 LAB — TYPE AND SCREEN
ABO/RH(D): O POS
ANTIBODY SCREEN: NEGATIVE

## 2016-10-24 LAB — CBC
HCT: 39.1 % (ref 36.0–46.0)
Hemoglobin: 12.8 g/dL (ref 12.0–15.0)
MCH: 30.3 pg (ref 26.0–34.0)
MCHC: 32.7 g/dL (ref 30.0–36.0)
MCV: 92.7 fL (ref 78.0–100.0)
PLATELETS: 281 10*3/uL (ref 150–400)
RBC: 4.22 MIL/uL (ref 3.87–5.11)
RDW: 13.7 % (ref 11.5–15.5)
WBC: 10.1 10*3/uL (ref 4.0–10.5)

## 2016-10-24 LAB — I-STAT BETA HCG BLOOD, ED (MC, WL, AP ONLY)

## 2016-10-24 LAB — OCCULT BLOOD, POC DEVICE: Fecal Occult Bld: POSITIVE — AB

## 2016-10-24 NOTE — ED Provider Notes (Signed)
[facility] DEPT Provider Note   CSN: [id number] Arrival date & time: 10/24/16  2016 By signing my name below, I, [name], attest that this documentation has been prepared under the direction and in the presence of Harvey Lingo, Annie Main, MD. Electronically Signed: [name], Scribe. 10/25/16. 12:03 AM.  History   Chief Complaint Chief Complaint  Patient presents with  . Rectal Bleeding   The history is provided by the patient. No language interpreter was used.   HPI Comments: Lori Murray is a 46 y.o. female who presents to the Emergency Department complaining of waxing and waning, bloody, mucus-type stool since 8 days.   She states when she goes to the bathroom, once daily she has had a BM with no stool, there is only mucus and blood.  In between these episodes, she has normal BM with no blood or mucus present. She states she went to urgent care when these symptoms occurred 3 times last night and was referred to come to the ED. She has associated bloating, abdominal cramping, pain when she has a BM, and decreased appetite. She denies vaginal bleeding, vomiting, and fever. Pt denies any pertinent PMHx or FMHx of related disease.   Past Medical History:  Diagnosis Date  . Anemia    iron deficiency  . Depression   . Fibroids    s/p TAH- right salpingo- oophorectomy in 2010  . Gardnerella vaginalis infection    recurrent  . History of tension headache   . History of tobacco abuse   . Horner's syndrome    dry eye syndrome: OS>OD 2/2 horner's syndrome. followed at [facility] center; LUL ptosis 2/2 Horner's; f/u with Dr. Levan Hurst  . Metrorrhagia    h/o treated with OCP.  Marland Kitchen Schwannoma 12/14/2008   Left sided benign Schwanoma( retropharyngeal space ) s/p resection in 01/11.       Patient Active Problem List   Diagnosis Date Noted  . Acute sinusitis 09/25/2016  . Preventative health care 06/21/2012  . Hemiplegia, unspecified, affecting nondominant side 12/29/2011  . History  of left-sided benign schwannoma s/p resection 2011  12/14/2008  . Schizophrenia ([place]) 03/17/2006  . Depression 03/17/2006    Past Surgical History:  Procedure Laterality Date  . ABDOMINOPLASTY    . benign schwannoma   06/20/09   excision from left neck, by Dr. Deniece Ree [facility])  . OOPHORECTOMY     L  . TUMOR REMOVAL      OB History    Gravida Para Term Preterm AB Living   4 4 4  0 0 3   SAB TAB Ectopic Multiple Live Births   0 0 0 0         Home Medications    Prior to Admission medications   Medication Sig Start Date End Date Taking? Authorizing Provider  benzonatate (TESSALON) 100 MG capsule Take 1 capsule (100 mg total) by mouth 3 (three) times daily as needed for cough. 09/24/16   Milagros Loll, MD  Multiple Vitamin (MULTIVITAMIN) tablet Take 1 tablet by mouth daily.    [provider]  venlafaxine XR (EFFEXOR-XR) 75 MG 24 hr capsule Take 1 capsule (75 mg total) by mouth daily with breakfast. 09/24/16   Milagros Loll, MD    Family History Family History  Problem Relation Age of Onset  . Diabetes Neg Hx   . Hypertension Neg Hx   . Depression Neg Hx     Social History Social History  Substance Use Topics  . Smoking status: Former  Smoker    Packs/day: 0.10    Types: Cigarettes  . Smokeless tobacco: Never Used     Comment: stopped 1 month ago  . Alcohol use No     Allergies   Patient has no known allergies.   Review of Systems Review of Systems  Constitutional: Positive for appetite change. Negative for fever.  Gastrointestinal: Positive for anal bleeding and blood in stool. Negative for vomiting.  Genitourinary: Negative for vaginal bleeding.   A complete 10 system review of systems was obtained and all systems are negative except as noted in the HPI and PMH.    Physical Exam Updated Vital Signs BP (!) 147/94 (BP Location: Right Arm)   Pulse (!) 59   Temp 98 F (36.7 C) (Oral)   Resp 18   Ht 5\' 4"  (1.626 m)   Wt 140 lb (63.5  kg)   LMP 07/28/2008   SpO2 100%   BMI 24.03 kg/m   Physical Exam  Constitutional: She is oriented to person, place, and time. She appears well-developed and well-nourished. No distress.  HENT:  Head: Normocephalic and atraumatic.  Mouth/Throat: Oropharynx is clear and moist. No oropharyngeal exudate.  Eyes: Conjunctivae and EOM are normal. Pupils are equal, round, and reactive to light.  Neck: Normal range of motion. Neck supple.  No meningismus.  Cardiovascular: Normal rate, regular rhythm, normal heart sounds and intact distal pulses.   No murmur heard. Pulmonary/Chest: Effort normal and breath sounds normal. No respiratory distress.  Abdominal: Soft. There is no tenderness. There is no rebound and no guarding.  Genitourinary:  Genitourinary Comments: Chaperone present for rectal exam Small external hemorrhoid No gross blood  Musculoskeletal: Normal range of motion. She exhibits no edema or tenderness.  Neurological: She is alert and oriented to person, place, and time. No cranial nerve deficit. She exhibits normal muscle tone. Coordination normal.   5/5 strength throughout. CN 2-12 intact.Equal grip strength.   Skin: Skin is warm.  Psychiatric: She has a normal mood and affect. Her behavior is normal.  Nursing note and vitals reviewed.    ED Treatments / Results  DIAGNOSTIC STUDIES: Oxygen Saturation is 100% on RA, normal by my interpretation.    COORDINATION OF CARE: 11:26 PM Discussed treatment plan with pt at bedside and pt agreed to plan. Labs (all labs ordered are listed, but only abnormal results are displayed) Labs Reviewed  URINALYSIS, ROUTINE W REFLEX MICROSCOPIC - Abnormal; Notable for the following:       Result Value   APPearance HAZY (*)    Bacteria, UA RARE (*)    Squamous Epithelial / LPF 0-5 (*)    All other components within normal limits  COMPREHENSIVE METABOLIC PANEL  CBC  I-STAT BETA HCG BLOOD, ED ([facility], [facility], [facility] ONLY)  TYPE AND SCREEN    EKG   EKG Interpretation None       Radiology Ct Abdomen Pelvis W Contrast  Result Date: 10/25/2016 CLINICAL DATA:  Abdominal pain and rectal bleeding. EXAM: CT ABDOMEN AND PELVIS WITH CONTRAST TECHNIQUE: Multidetector CT imaging of the abdomen and pelvis was performed using the standard protocol following bolus administration of intravenous contrast. CONTRAST:  100 cc Isovue-300 IV COMPARISON:  CT 03/20/2014 FINDINGS: Lower chest: No consolidation or pleural fluid. Minimal dependent atelectasis. Hepatobiliary: No focal liver abnormality is seen. No gallstones, gallbladder wall thickening, or biliary dilatation. Pancreas: No ductal dilatation or inflammation. Spleen: Normal in size. Subcentimeter subcapsular hypodensity in posterior spleen is unchanged. Adrenals/Urinary Tract: Normal adrenal glands. No  hydronephrosis or perinephric edema. Urinary bladder is decompressed. There is excreted intravenous contrast in the bladder. Stomach/Bowel: There is wall thickening of the distal sigmoid colon. Moderate stool in the remainder of the colon. The appendix is normal. No small bowel inflammation, wall thickening or obstruction. Stomach is decompressed. Vascular/Lymphatic: No significant vascular findings are present. No enlarged abdominal or pelvic lymph nodes. Reproductive: Left ovary is normal in size. Right ovary not visualized. Uterus appears surgically absent. Air in the vagina. Other: No free air, free fluid, or intra-abdominal fluid collection. Musculoskeletal: There are no acute or suspicious osseous abnormalities. IMPRESSION: Short segment wall thickening in the distal sigmoid colon, suspicious for short-segment colitis. Recommend direct visualization with colonoscopy after resolution of acute symptoms to exclude underlying colonic neoplasm. Electronically Signed   By: Jeb Levering M.D.   On: 10/25/2016 01:33    Procedures Procedures (including critical care time)  Medications Ordered in  ED Medications - No data to display   Initial Impression / Assessment and Plan / ED Course  I have reviewed the triage vital signs and the nursing notes.  Pertinent labs & imaging results that were available during my care of the patient were reviewed by me and considered in my medical decision making (see chart for details).     Patient urgent care with week history of abdominal cramping and bloody mucus rectal discharge. Denies blood mixed with her stools. Denies vomiting.  Hemoglobin and vitals stable  CT with sigmoid colitis.  Will start cipro and flagyl.  Need GI followup to exclude mass.  Pain controlled in the ED. Abdomen soft. Tolerating PO.  Appears stable for outpatient followup. Return precautions discussed.  Final Clinical Impressions(s) / ED Diagnoses   Final diagnoses:  Colitis    New Prescriptions New Prescriptions   No medications on file  I personally performed the services described in this documentation, which was scribed in my presence. The recorded information has been reviewed and is accurate.     Ezequiel Essex, MD 10/25/16 [phone]

## 2016-10-24 NOTE — ED Provider Notes (Signed)
CSN: [id number]     Arrival date & time 10/24/16  1809 History   First MD Initiated Contact with Patient 10/24/16 1958     Chief Complaint  Patient presents with  . Rectal Bleeding   (Consider location/radiation/quality/duration/timing/severity/associated sxs/prior Treatment) Lori Murray is a 46 y.o. female here for evaluation of blood in stool. Patient has associated symptoms of intermittent lower abdominal pain with visible blood within mucous-like discharge with streaks of bright red blood. The patient denies any constipation. The patient has had 3 episodes of rectal bleeding within the past 24 hours. The patient has no history of GI bleed. No known risk factors. She denies any aspirin, NSAID or anticoagulant use. No prior colonoscopy. There is not a history of rectal injury. Patient has not had similar episodes of rectal bleeding in the past.  The following portions of the patient's history were reviewed and updated as appropriate: allergies, current medications, past family history, past medical history, past social history, past surgical history and problem list.        Past Medical History:  Diagnosis Date  . Anemia    iron deficiency  . Depression   . Fibroids    s/p TAH- right salpingo- oophorectomy in 2010  . Gardnerella vaginalis infection    recurrent  . History of tension headache   . History of tobacco abuse   . Horner's syndrome    dry eye syndrome: OS>OD 2/2 horner's syndrome. followed at [facility] center; LUL ptosis 2/2 Horner's; f/u with Dr. Levan Hurst  . Metrorrhagia    h/o treated with OCP.  Marland Kitchen Schwannoma 12/14/2008   Left sided benign Schwanoma( retropharyngeal space ) s/p resection in 01/11.      Past Surgical History:  Procedure Laterality Date  . ABDOMINOPLASTY    . benign schwannoma   06/20/09   excision from left neck, by Dr. Deniece Ree [facility])  . OOPHORECTOMY     L  . TUMOR REMOVAL     Family History  Problem Relation Age of Onset  . Diabetes  Neg Hx   . Hypertension Neg Hx   . Depression Neg Hx    Social History  Substance Use Topics  . Smoking status: Former Smoker    Packs/day: 0.10    Types: Cigarettes  . Smokeless tobacco: Never Used     Comment: stopped 1 month ago  . Alcohol use No   OB History    Gravida Para Term Preterm AB Living   4 4 4  0 0 3   SAB TAB Ectopic Multiple Live Births   0 0 0 0       Review of Systems  Constitutional: Negative for fever.  Gastrointestinal: Positive for abdominal pain and blood in stool. Negative for abdominal distention, anal bleeding, constipation, diarrhea, nausea, rectal pain and vomiting.  All other systems reviewed and are negative.   Allergies  Patient has no known allergies.  Home Medications   Prior to Admission medications   Medication Sig Start Date End Date Taking? Authorizing Provider  Multiple Vitamin (MULTIVITAMIN) tablet Take 1 tablet by mouth daily.   Yes [provider]  venlafaxine XR (EFFEXOR-XR) 75 MG 24 hr capsule Take 1 capsule (75 mg total) by mouth daily with breakfast. 09/24/16  Yes Milagros Loll, MD  benzonatate (TESSALON) 100 MG capsule Take 1 capsule (100 mg total) by mouth 3 (three) times daily as needed for cough. 09/24/16   Milagros Loll, MD   Meds Ordered and Administered this Visit  Medications - No data to display  BP 123/67 (BP Location: Right Arm)   Pulse 61   Temp 98 F (36.7 C) (Oral)   Resp 20   LMP 07/28/2008   SpO2 99%  No data found.   Physical Exam  Constitutional: She appears well-developed and well-nourished. No distress.  Cardiovascular: Normal rate and regular rhythm.   Pulmonary/Chest: Effort normal and breath sounds normal.  Abdominal: Soft. Bowel sounds are normal. She exhibits no distension. There is no tenderness.  Genitourinary: Rectal exam shows guaiac positive stool.  Genitourinary Comments: Anus without small, non-inflamed single external hemorrhoid. Normal rectal tone without any massess.  White mucus-like discharge noted in rectal vault.     Urgent Care Course     Procedures (including critical care time)  Labs Review Labs Reviewed - No data to display  Imaging Review No results found.   Visual Acuity Review  Right Eye Distance:   Left Eye Distance:   Bilateral Distance:    Right Eye Near:   Left Eye Near:    Bilateral Near:         MDM   1. Hematochezia    46 year old Hispanic female presenting with 3 episodes of hematochezia over the past 24 hours with associating intermittent lower abdominal cramping. No history of GI bleed or known risk factors. She is not on any aspirin, NSAIDs or anticoagulants. No prior colonoscopy or GI evaluation. Rectal exam relatively benign with the exception of a small, noninflamed external hemorrhoid. Small amount of clear mucus-like discharge from the rectal vault which was occult positive. Will send patient to ER for further evaluation and to rule out acute lower GI bleed.  Discussed diagnosis and treatment with patient. All questions have been answered and all concerns have been addressed. The patient verbalized understanding and had no further questions     Enrique Sack, [place] 10/24/16 2009

## 2016-10-24 NOTE — ED Triage Notes (Signed)
Reports having abdominal cramping 8 days ago with mucous type stool with blood mixed in.  Reports three of these stools since last night.

## 2016-10-24 NOTE — ED Triage Notes (Signed)
Pt c/o bloody and mucous stools onset 1 week associated w/decreased appetite  Denies fevers, chills, nauseas, vomiting  A&O x4... NAD... Ambulatory

## 2016-10-24 NOTE — Discharge Instructions (Signed)
Go to ER now for further evaluation of your rectal bleeding

## 2016-10-25 ENCOUNTER — Encounter (HOSPITAL_COMMUNITY): Payer: Self-pay | Admitting: Radiology

## 2016-10-25 ENCOUNTER — Emergency Department (HOSPITAL_COMMUNITY): Payer: BLUE CROSS/BLUE SHIELD

## 2016-10-25 LAB — URINALYSIS, ROUTINE W REFLEX MICROSCOPIC
Bilirubin Urine: NEGATIVE
GLUCOSE, UA: NEGATIVE mg/dL
HGB URINE DIPSTICK: NEGATIVE
KETONES UR: NEGATIVE mg/dL
LEUKOCYTES UA: NEGATIVE
NITRITE: NEGATIVE
PROTEIN: NEGATIVE mg/dL
Specific Gravity, Urine: 1.027 (ref 1.005–1.030)
pH: 6 (ref 5.0–8.0)

## 2016-10-25 MED ORDER — IOPAMIDOL (ISOVUE-300) INJECTION 61%
INTRAVENOUS | Status: AC
  Administered 2016-10-25: 100 mL
  Filled 2016-10-25: qty 100

## 2016-10-25 MED ORDER — METRONIDAZOLE 500 MG PO TABS: 500.0000 mg | ORAL_TABLET | Freq: Two times a day (BID) | ORAL | 0 refills | Status: DC

## 2016-10-25 MED ORDER — CIPROFLOXACIN HCL 500 MG PO TABS: 500.0000 mg | ORAL_TABLET | Freq: Two times a day (BID) | ORAL | 0 refills | Status: DC

## 2016-10-25 MED ORDER — CIPROFLOXACIN IN D5W 400 MG/200ML IV SOLN
400.0000 mg | Freq: Once | INTRAVENOUS | Status: AC
  Administered 2016-10-25: 400 mg via INTRAVENOUS
  Filled 2016-10-25: qty 200

## 2016-10-25 MED ORDER — METRONIDAZOLE IN NACL 5-0.79 MG/ML-% IV SOLN
500.0000 mg | Freq: Once | INTRAVENOUS | Status: AC
  Administered 2016-10-25: 500 mg via INTRAVENOUS
  Filled 2016-10-25: qty 100

## 2016-10-25 MED ORDER — ONDANSETRON 4 MG PO TBDP: 4.0000 mg | ORAL_TABLET | Freq: Three times a day (TID) | ORAL | 0 refills | Status: DC | PRN

## 2016-10-25 NOTE — Discharge Instructions (Signed)
Take antibiotics as prescribed. Follow-up with a gastroenterologist. You should have a colonoscopy once her symptoms are improved. Return to the ED if you develop new or worsening symptoms.

## 2016-10-25 NOTE — ED Notes (Signed)
Compatibility verified through [employer].

## 2016-11-05 ENCOUNTER — Ambulatory Visit (INDEPENDENT_AMBULATORY_CARE_PROVIDER_SITE_OTHER): Payer: BLUE CROSS/BLUE SHIELD | Admitting: Internal Medicine

## 2016-11-05 ENCOUNTER — Ambulatory Visit: Payer: BLUE CROSS/BLUE SHIELD

## 2016-11-05 VITALS — BP 117/71 | HR 62 | Temp 97.5°F | Wt 143.7 lb

## 2016-11-05 DIAGNOSIS — Z79899 Other long term (current) drug therapy: Secondary | ICD-10-CM | POA: Diagnosis not present

## 2016-11-05 DIAGNOSIS — F339 Major depressive disorder, recurrent, unspecified: Secondary | ICD-10-CM | POA: Diagnosis not present

## 2016-11-05 MED ORDER — VENLAFAXINE HCL ER 75 MG PO CP24: 75.0000 mg | ORAL_CAPSULE | Freq: Every day | ORAL | 2 refills | Status: DC

## 2016-11-05 NOTE — Patient Instructions (Signed)
I am glad you are feeling better.  Refilled your prescription  F/up with your regular doctor in 3 months.

## 2016-11-05 NOTE — Progress Notes (Signed)
   CC: f/up depression  HPI:  Lori Murray is a 46 y.o. with pmh as listed below is here for depression f/up   Recently went to ED with bloody stool and mucous in the stool, with abd cramp, CT showed sigmoid colitis, was treated with cipro and flagyl. Completed abx yesterday, no longer having GI symptoms such as bleeding or cramps. Has GI appt tomorrow which was done by ED referral.   She is doing well with her depression, tolerating the Effexor 75 mg XR. No side effects. No suicidal or homicidal thoughts.   Past Medical History:  Diagnosis Date  . Anemia    iron deficiency  . Depression   . Fibroids    s/p TAH- right salpingo- oophorectomy in 2010  . Gardnerella vaginalis infection    recurrent  . History of tension headache   . History of tobacco abuse   . Horner's syndrome    dry eye syndrome: OS>OD 2/2 horner's syndrome. followed at [facility] center; LUL ptosis 2/2 Horner's; f/u with Dr. Levan Hurst  . Metrorrhagia    h/o treated with OCP.  Marland Kitchen Schwannoma 12/14/2008   Left sided benign Schwanoma( retropharyngeal space ) s/p resection in 01/11.       Review of Systems:   Review of Systems  Constitutional: Negative for chills and fever.  Cardiovascular: Negative for chest pain and palpitations.  Gastrointestinal: Negative for abdominal pain, blood in stool, diarrhea, heartburn, nausea and vomiting.  Neurological: Negative for dizziness and headaches.  Psychiatric/Behavioral: Negative for depression, hallucinations, substance abuse and suicidal ideas. The patient is not nervous/anxious.      Physical Exam:  Vitals:   11/05/16 1332  BP: 117/71  Pulse: 62  Temp: 97.5 F (36.4 C)  TempSrc: Oral  SpO2: 100%  Weight: 143 lb 11.2 oz (65.2 kg)   Physical Exam  Constitutional: She is oriented to person, place, and time. She appears well-developed and well-nourished. No distress.  HENT:  Head: Normocephalic and atraumatic.  Cardiovascular: Normal rate and regular  rhythm.  Exam reveals no friction rub.   No murmur heard. Respiratory: Effort normal and breath sounds normal. No respiratory distress. She has no wheezes.  GI: Soft. Bowel sounds are normal. There is no tenderness.  Neurological: She is alert and oriented to person, place, and time.  Skin: She is not diaphoretic.  Psychiatric: She has a normal mood and affect.    Assessment & Plan:   See Encounters Tab for problem based charting.  Patient discussed with Dr. Angelia Mould

## 2016-11-05 NOTE — Assessment & Plan Note (Signed)
Doing well with Effexor 75 mg XR. Will refill it  F/up in 3 months.

## 2016-11-07 NOTE — Progress Notes (Signed)
Internal Medicine Clinic Attending  Case discussed with Dr. Ahmed at the time of the visit.  We reviewed the resident's history and exam and pertinent patient test results.  I agree with the assessment, diagnosis, and plan of care documented in the resident's note. 

## 2016-11-17 ENCOUNTER — Encounter: Payer: Self-pay | Admitting: *Deleted

## 2017-03-09 ENCOUNTER — Encounter: Payer: Self-pay | Admitting: Internal Medicine

## 2017-03-09 ENCOUNTER — Ambulatory Visit: Payer: BLUE CROSS/BLUE SHIELD

## 2017-03-18 ENCOUNTER — Ambulatory Visit (INDEPENDENT_AMBULATORY_CARE_PROVIDER_SITE_OTHER): Payer: BLUE CROSS/BLUE SHIELD | Admitting: Internal Medicine

## 2017-03-18 VITALS — BP 121/71 | HR 61 | Temp 97.6°F | Wt 134.9 lb

## 2017-03-18 DIAGNOSIS — F339 Major depressive disorder, recurrent, unspecified: Secondary | ICD-10-CM

## 2017-03-18 DIAGNOSIS — Z0000 Encounter for general adult medical examination without abnormal findings: Secondary | ICD-10-CM | POA: Diagnosis not present

## 2017-03-18 DIAGNOSIS — Z23 Encounter for immunization: Secondary | ICD-10-CM | POA: Diagnosis not present

## 2017-03-18 MED ORDER — VENLAFAXINE HCL ER 75 MG PO CP24: 150.0000 mg | ORAL_CAPSULE | Freq: Every day | ORAL | 1 refills | Status: DC

## 2017-03-18 NOTE — Progress Notes (Signed)
    CC: depression, HM HPI: [name] is a 46 y.o. woman with PMH noted below here for depression and HM  Please see Problem List/A&P for the status of the patient's chronic medical problems   Past Medical History:  Diagnosis Date  . Anemia    iron deficiency  . Depression   . Fibroids    s/p TAH- right salpingo- oophorectomy in 2010  . Gardnerella vaginalis infection    recurrent  . History of tension headache   . History of tobacco abuse   . Horner's syndrome    dry eye syndrome: OS>OD 2/2 horner's syndrome. followed at [facility] center; LUL ptosis 2/2 Horner's; f/u with Dr. Levan Murray  . Metrorrhagia    h/o treated with OCP.  Marland Kitchen Schwannoma 12/14/2008   Left sided benign Schwanoma( retropharyngeal space ) s/p resection in 01/11.       Review of Systems: Denies fevers, chills, has some fatigue Denies n,v,abd pain,  Denies joint pains  Physical Exam: Vitals:   03/18/17 0921  BP: 121/71  Pulse: 61  Temp: 97.6 F (36.4 C)  TempSrc: Oral  SpO2: 100%  Weight: 134 lb 14.4 oz (61.2 kg)    General: A&O, in NAD HEENT: EOMI, sclera white, conjunctiva pink  Neck: supple, midline trachea CV: RRR, normal s1, s2, no m/r/g Resp: equal and symmetric breath sounds, no wheezing or crackles  Abdomen: soft, nontender, nondistended, +BS Extremities: no edema   Assessment & Plan:   See encounters tab for problem based medical decision making. Patient discussed with Dr. Evette Murray

## 2017-03-18 NOTE — Progress Notes (Signed)
Internal Medicine Clinic Attending  Case discussed with Dr. Saraiya at the time of the visit.  We reviewed the resident's history and exam and pertinent patient test results.  I agree with the assessment, diagnosis, and plan of care documented in the resident's note.  

## 2017-03-18 NOTE — Assessment & Plan Note (Signed)
She received flu vaccine today

## 2017-03-18 NOTE — Assessment & Plan Note (Signed)
Patient is here for follow up of her depression. She says that she has been taking the effexor daily and is tolerating it well. However, she has had a lot of things going on, with her soin being in jail for a month and so she is very stressed about it. She has had poor sleep, difficulty concentrating, increased fatigue, and her PHQ-9 score was a /10/ today. She believes that the effexor is helping her and is willing to try an increased dose.  She does not want intervention currently for behavioral therapy.  Also, schizophrenia was listed in [place]- she does not have that so I have deleted that diagnosis.  She is very active person,and has 2 other daughters who she says have good rapport with them.  Plan -increased venlafaxine to 150 mg daily -she will follow up in 1 month

## 2017-03-18 NOTE — Patient Instructions (Signed)
Thank you for your visit today Please take 150 mg of the effexor daily (2 tablets of 75 mg)  Please follow up in 4 weeks

## 2017-04-15 ENCOUNTER — Ambulatory Visit: Payer: BLUE CROSS/BLUE SHIELD

## 2017-04-15 ENCOUNTER — Encounter: Payer: Self-pay | Admitting: Internal Medicine

## 2017-06-12 ENCOUNTER — Encounter (INDEPENDENT_AMBULATORY_CARE_PROVIDER_SITE_OTHER): Payer: Self-pay

## 2017-06-12 ENCOUNTER — Ambulatory Visit: Payer: BLUE CROSS/BLUE SHIELD | Admitting: Internal Medicine

## 2017-06-12 ENCOUNTER — Other Ambulatory Visit: Payer: Self-pay

## 2017-06-12 ENCOUNTER — Encounter: Payer: Self-pay | Admitting: Internal Medicine

## 2017-06-12 DIAGNOSIS — N393 Stress incontinence (female) (male): Secondary | ICD-10-CM

## 2017-06-12 DIAGNOSIS — Z90710 Acquired absence of both cervix and uterus: Secondary | ICD-10-CM | POA: Diagnosis not present

## 2017-06-12 DIAGNOSIS — R5383 Other fatigue: Secondary | ICD-10-CM | POA: Diagnosis not present

## 2017-06-12 DIAGNOSIS — R05 Cough: Secondary | ICD-10-CM

## 2017-06-12 DIAGNOSIS — R32 Unspecified urinary incontinence: Secondary | ICD-10-CM | POA: Insufficient documentation

## 2017-06-12 DIAGNOSIS — R6883 Chills (without fever): Secondary | ICD-10-CM

## 2017-06-12 DIAGNOSIS — Z8742 Personal history of other diseases of the female genital tract: Secondary | ICD-10-CM

## 2017-06-12 DIAGNOSIS — R0981 Nasal congestion: Secondary | ICD-10-CM

## 2017-06-12 DIAGNOSIS — R51 Headache: Secondary | ICD-10-CM | POA: Diagnosis not present

## 2017-06-12 DIAGNOSIS — R5381 Other malaise: Secondary | ICD-10-CM | POA: Diagnosis not present

## 2017-06-12 DIAGNOSIS — R059 Cough, unspecified: Secondary | ICD-10-CM

## 2017-06-12 HISTORY — DX: Unspecified urinary incontinence: R32

## 2017-06-12 MED ORDER — SALINE SPRAY 0.65 % NA SOLN: NASAL | 2 refills | Status: DC

## 2017-06-12 MED ORDER — BENZONATATE 100 MG PO CAPS: ORAL_CAPSULE | ORAL | 0 refills | Status: DC

## 2017-06-12 NOTE — Assessment & Plan Note (Signed)
Patient reports headache, nasal congestion, and dry cough that started 3 weeks ago.  She also endorses subjective fevers and chills.  Has been taking Tylenol at home for fevers.  She also took TheraFlu for her symptoms which seemed to help.  States she now feels her nose and mouth are dry and is having a persistent dry cough that is worse at night.  Denies any sick contacts.  Denies recent fever, headache, congestion, and myalgias.  Received flu shot on 03/2017.  Here in clinic she is afebrile and her lung exam is unremarkable.  Recommended Tessalon Perles which she is willing to try as these have worked very well for her in the past.  Discussed with patient this is likely a viral URI and she is now experiencing a post viral cough that can persist for several weeks.   -Tessalon Perles 3 times daily - Ocean nasal spray for moisturizing  -Patient advised to call clinic if symptoms worsen

## 2017-06-12 NOTE — Progress Notes (Signed)
   CC: Cough and urinary incontinence  HPI:  [name] is a 47 y.o. female with PMH listed below who presents to clinic with a cough and urinary incontinence.  Please see problem based assessment and plan for further details.  Past Medical History:  Diagnosis Date  . Anemia    iron deficiency  . Depression   . Fibroids    s/p TAH- right salpingo- oophorectomy in 2010  . Gardnerella vaginalis infection    recurrent  . History of tension headache   . History of tobacco abuse   . Horner's syndrome    dry eye syndrome: OS>OD 2/2 horner's syndrome. followed at [facility] center; LUL ptosis 2/2 Horner's; f/u with Dr. Levan Hurst  . Metrorrhagia    h/o treated with OCP.  Marland Kitchen Schwannoma 12/14/2008   Left sided benign Schwanoma( retropharyngeal space ) s/p resection in 01/11.      Review of Systems:   Review of Systems  Constitutional: Positive for chills and malaise/fatigue. Negative for fever.  HENT: Negative for congestion, nosebleeds, sinus pain and sore throat.        Dry mouth   Eyes: Negative for discharge and redness.  Gastrointestinal: Negative for abdominal pain, constipation, nausea and vomiting.  Genitourinary: Negative for dysuria, frequency, hematuria and urgency.       Incontinence   Musculoskeletal: Negative for myalgias.    Physical Exam:  Vitals:   06/12/17 1456  BP: 124/79  Pulse: 79  Temp: 97.8 F (36.6 C)  TempSrc: Oral  SpO2: 98%  Weight: 142 lb 14.4 oz (64.8 kg)  Height: 5\' 2"  (1.575 m)   General: very pleasant female, well-nourished, well-developed, sitting up in chair in no acute distress HENT: NCAT, neck supple and FROM, no adenopathy, OP clear without exudates or erythema, normal dentition, wears braces   Cardiac: regular rate and rhythm, nl S1/S2, no murmurs, rubs or gallops  Pulm: CTAB, no wheezes or crackles, no increased work of breathing  Abd: soft, NTND, bowel sounds present  Ext: warm and well perfused, no peripheral edema      Assessment & Plan:   See Encounters Tab for problem based charting.  Patient discussed with Dr. Dareen Piano

## 2017-06-12 NOTE — Patient Instructions (Addendum)
Fue un Armed forces operational officer. Le recete Ocean spray para la resequedad [employer] respiratorias y las perlas de Tessalon para la tos. Ya envie la receta a su farmacia. Tambien hice un referido para la terapia fisica, ellos la Lucianne Lei a llamar para coordinar una cita. Llame a la clinica al [phone] si tiene alguna pregunta. Puede hacer una cita de seguimiento conmigo en 6 meses.

## 2017-06-12 NOTE — Assessment & Plan Note (Signed)
Patient reports urinary incontinence when she coughs, sneezes, and runs in the gym.  States volume of urine varies, but is sometimes significant enough for her to require a change of clothes.  She appears very distressed by this and wants something to be done right away.  Denies dysuria, urgency, frequency, and constipation.  Denies caffeine intake. She has a history of hysterectomy in 2008 for uterine fibroids.  She is not on any medication that could cause this.  Discussed with patient this is likely stress urinary incontinence, and see after hysterectomies.  Discussed with patient there is no medication to treat this and that the best course of treatment is physical therapy for Kegel exercises.  She is agreeable to this.  - Referral for PT for pelvic floor strengthening - Can try topical vaginal estrogen cream if no improvement with PT  -Advised patient to call with any questions

## 2017-06-15 ENCOUNTER — Other Ambulatory Visit: Payer: Self-pay | Admitting: Internal Medicine

## 2017-06-15 DIAGNOSIS — N393 Stress incontinence (female) (male): Secondary | ICD-10-CM

## 2017-06-15 NOTE — Progress Notes (Signed)
Internal Medicine Clinic Attending  Case discussed with Dr. Santos-Sanchez at the time of the visit.  We reviewed the resident's history and exam and pertinent patient test results.  I agree with the assessment, diagnosis, and plan of care documented in the resident's note.    

## 2017-06-15 NOTE — Addendum Note (Signed)
Addended by: Hulan Fray on: 06/15/2017 06:55 PM   Modules accepted: Orders

## 2017-06-18 ENCOUNTER — Telehealth: Payer: Self-pay | Admitting: *Deleted

## 2017-06-18 DIAGNOSIS — R059 Cough, unspecified: Secondary | ICD-10-CM

## 2017-06-18 DIAGNOSIS — R05 Cough: Secondary | ICD-10-CM

## 2017-06-18 MED ORDER — BENZONATATE 100 MG PO CAPS: ORAL_CAPSULE | ORAL | 0 refills | Status: DC

## 2017-06-18 MED ORDER — VENLAFAXINE HCL ER 150 MG PO CP24: 150.0000 mg | ORAL_CAPSULE | Freq: Every day | ORAL | 2 refills | Status: DC

## 2017-06-18 NOTE — Telephone Encounter (Signed)
Call from patient stating pharmacy was unable to fill her rx because it was written in [place].  Pharmacy requesting new rx to be sent over.  Pt also states she needs a refill on her venlafaxine XR-will send to pcp for consideration, please advise.Lori Murray, Lori Segall Cassady1/10/201910:41 AM

## 2017-06-18 NOTE — Telephone Encounter (Signed)
Resend prescription and refilled Effexor.

## 2017-10-02 NOTE — Addendum Note (Signed)
Addended by: Hulan Fray on: 10/02/2017 06:14 PM   Modules accepted: Orders

## 2018-01-08 ENCOUNTER — Encounter: Payer: Self-pay | Admitting: Internal Medicine

## 2018-01-08 ENCOUNTER — Ambulatory Visit: Payer: BLUE CROSS/BLUE SHIELD | Admitting: Internal Medicine

## 2018-01-08 VITALS — BP 126/83 | HR 66 | Temp 99.0°F | Ht 62.0 in | Wt 148.4 lb

## 2018-01-08 DIAGNOSIS — R29818 Other symptoms and signs involving the nervous system: Secondary | ICD-10-CM

## 2018-01-08 DIAGNOSIS — F339 Major depressive disorder, recurrent, unspecified: Secondary | ICD-10-CM

## 2018-01-08 DIAGNOSIS — G4700 Insomnia, unspecified: Secondary | ICD-10-CM | POA: Diagnosis not present

## 2018-01-08 DIAGNOSIS — Z79899 Other long term (current) drug therapy: Secondary | ICD-10-CM

## 2018-01-08 DIAGNOSIS — R102 Pelvic and perineal pain: Secondary | ICD-10-CM | POA: Diagnosis not present

## 2018-01-08 DIAGNOSIS — N3090 Cystitis, unspecified without hematuria: Secondary | ICD-10-CM | POA: Diagnosis not present

## 2018-01-08 DIAGNOSIS — H9202 Otalgia, left ear: Secondary | ICD-10-CM | POA: Diagnosis not present

## 2018-01-08 DIAGNOSIS — H538 Other visual disturbances: Secondary | ICD-10-CM

## 2018-01-08 DIAGNOSIS — Z86018 Personal history of other benign neoplasm: Secondary | ICD-10-CM

## 2018-01-08 HISTORY — DX: Other symptoms and signs involving the nervous system: R29.818

## 2018-01-08 LAB — POCT URINALYSIS DIPSTICK
Bilirubin, UA: NEGATIVE
Glucose, UA: NEGATIVE
Ketones, UA: NEGATIVE
Leukocytes, UA: NEGATIVE
NITRITE UA: POSITIVE
Protein, UA: NEGATIVE
Spec Grav, UA: 1.025 (ref 1.010–1.025)
UROBILINOGEN UA: 0.2 U/dL
pH, UA: 5.5 (ref 5.0–8.0)

## 2018-01-08 MED ORDER — SERTRALINE HCL 50 MG PO TABS: 50.0000 mg | ORAL_TABLET | Freq: Every day | ORAL | 2 refills | Status: DC

## 2018-01-08 MED ORDER — NITROFURANTOIN MONOHYD MACRO 100 MG PO CAPS: 100.0000 mg | ORAL_CAPSULE | Freq: Two times a day (BID) | ORAL | 0 refills | Status: AC

## 2018-01-08 NOTE — Progress Notes (Signed)
CC: Suprapubic pain, left ear pain, depression   HPI:   [name] is a 47 y.o. female with PMH listed below who presents to clinic for evaluation of suprapubic pain and left ear pain.  Suprapubic pain: Patient presents with a one-week history of suprapubic pain associated with urinary frequency and urgency.  Denies dysuria, discharge, and hematuria.  States she tried an over-the-counter remedy for her symptoms that did not work.  Has had urinary tract infections in the past that presented with similar symptoms.  UA today showed trace blood and nitrites without leukocytes.  Will treat for uncomplicated cystitis. - Macrobid 100 mg BID x 5 days   Left ear pain: Patient presents with several months history of pressure sensation behind her left ear that is transient in nature and associated with vision changes (blurriness).  She has a history of bilateral schwannomas and is status post resection of left-sided schwannoma in 2011 by neurosurgery.  She does report having the same symptoms she is having today at that time.  Ear exam today is normal, no signs of infection appreciated and TMs appeared normal.  Visible scar behind left ear from prior surgery.  No tenderness.  Concern for recurrent schwannoma given patient's past medical history. - MRI brain without contrast for further evaluation - Patient will schedule appointment with her ophthalmologist for evaluation of visual changes  Depression: Patient has a long history of depression that was well controlled on Effexor 150 mg daily.  She reports she self discontinued this medication about 3 months ago due to concern for side effects.  She now endorses insomnia, feelings of guilt of her inability to help her son overcome drug addiction, poor appetite, decreased energy, forgetfulness, and decreased energy.  Discussed with patient ideally would like to restart her Effexor given improvement in symptoms in the past, however patient declined.   Will start SSRI therapy. - Start Zoloft 50 mg QD and follow up in 6-8 weeks    Past Medical History:  Diagnosis Date  . Anemia    iron deficiency  . Depression   . Fibroids    s/p TAH- right salpingo- oophorectomy in 2010  . Gardnerella vaginalis infection    recurrent  . History of tension headache   . History of tobacco abuse   . Horner's syndrome    dry eye syndrome: OS>OD 2/2 horner's syndrome. followed at [facility] center; LUL ptosis 2/2 Horner's; f/u with Dr. Levan Hurst  . Metrorrhagia    h/o treated with OCP.  Marland Kitchen Schwannoma 12/14/2008   Left sided benign Schwanoma( retropharyngeal space ) s/p resection in 01/11.      Review of Systems:   Review of Systems  Constitutional: Negative for chills and fever.  HENT: Positive for ear pain. Negative for hearing loss and tinnitus.   Eyes: Positive for blurred vision. Negative for double vision, photophobia and pain.  Neurological: Negative for dizziness and headaches.  Psychiatric/Behavioral: Positive for depression and memory loss. Negative for substance abuse. The patient has insomnia.     Physical Exam:  There were no vitals filed for this visit.  General: Young female, well-developed, well-nourished, sitting up in chair in no acute distress Eyes: PERRL, EOMI, no conjunctival injection or scleral icterus Ear: No abnormalities appreciated on external exam, auditory meatus without area erythema, TM pearly white bilaterally, no effusions appreciated Neck: no lymphadenopathy Psych: Described mood as sad, appropriate affect, normal attention, good judgment and insight, denies SI/HI  Assessment & Plan:   See Encounters Tab  for problem based charting.  Patient discussed with Dr. Dareen Piano

## 2018-01-08 NOTE — Assessment & Plan Note (Signed)
Left ear pain: Patient presents with several months history of pressure sensation behind her left ear that is transient in nature and associated with vision changes (blurriness).  She has a history of bilateral schwannomas and is status post resection of left-sided schwannoma in 2011 by neurosurgery.  She does report having the same symptoms she is having today at that time.  Ear exam today is normal, no signs of infection appreciated and TMs appeared normal.  Visible scar behind left ear from prior surgery.  No tenderness.  Concern for recurrent schwannoma given patient's past medical history. - MRI brain without contrast for further evaluation - Patient will schedule appointment with her ophthalmologist for evaluation of visual changes

## 2018-01-08 NOTE — Assessment & Plan Note (Signed)
Depression: Patient has a long history of depression that was well controlled on Effexor 150 mg daily.  She reports she self discontinued this medication about 3 months ago due to concern for side effects.  She now endorses insomnia, feelings of guilt of her inability to help her son overcome drug addiction, poor appetite, decreased energy, forgetfulness, and decreased energy.  Discussed with patient ideally would like to restart her Effexor given improvement in symptoms in the past, however patient declined.  Will start SSRI therapy. - Start Zoloft 50 mg QD and follow up in 6-8 weeks

## 2018-01-08 NOTE — Assessment & Plan Note (Signed)
Suprapubic pain: Patient presents with a one-week history of suprapubic pain associated with urinary frequency and urgency.  Denies dysuria, discharge, and hematuria.  States she tried an over-the-counter remedy for her symptoms that did not work.  Has had urinary tract infections in the past that presented with similar symptoms.  UA today showed trace blood and nitrites without leukocytes.  Will treat for uncomplicated cystitis. - Macrobid 100 mg BID x 5 days

## 2018-01-08 NOTE — Patient Instructions (Addendum)
Lori Murray,   Su dolor de abdomen se debe a una infeccin de [place]. Para tratarla, le envi un antibitico llamado Macrobid a su farmacia de [employer]. Se va a tomar 1 tablets [place] por 5 [place].   Para su dolor de odo voy a ordenar un MRI. El departamento de radiologia la va a llamar para coordinar la fecha y [place].   Quiero que llame a su oftalmlogo y haga una cita para que la evaluen por perdida de vision en el ojo izquierdo.   Para su depresion, vamos a cambiar su medicamento a Zoloft. Se va a tomar una tableta de 37 miligramos todos los dias. Haga una cita de seguimiento conmigo en 2 meses para ver si el medicamento esta funcionando.   Recuerde ir a la cita con la ginecologa, ella es la encargada de ayudarla con las terapias fisicas.    Llame a la clnica si tiene alguna pregunta.   - Dra. Frederico Hamman

## 2018-01-11 NOTE — Progress Notes (Signed)
Internal Medicine Clinic Attending  Case discussed with Dr. Santos-Sanchez at the time of the visit.  We reviewed the resident's history and exam and pertinent patient test results.  I agree with the assessment, diagnosis, and plan of care documented in the resident's note.    

## 2018-02-04 ENCOUNTER — Ambulatory Visit (HOSPITAL_COMMUNITY): Admission: RE | Admit: 2018-02-04 | Payer: BLUE CROSS/BLUE SHIELD | Source: Ambulatory Visit

## 2018-02-17 ENCOUNTER — Ambulatory Visit (HOSPITAL_COMMUNITY): Payer: BLUE CROSS/BLUE SHIELD | Attending: Internal Medicine

## 2018-03-15 ENCOUNTER — Other Ambulatory Visit: Payer: Self-pay

## 2018-03-15 ENCOUNTER — Ambulatory Visit: Payer: BLUE CROSS/BLUE SHIELD | Admitting: Internal Medicine

## 2018-03-15 VITALS — BP 131/70 | HR 50 | Temp 98.2°F | Ht 62.0 in | Wt 147.8 lb

## 2018-03-15 DIAGNOSIS — H9202 Otalgia, left ear: Secondary | ICD-10-CM

## 2018-03-15 DIAGNOSIS — J029 Acute pharyngitis, unspecified: Secondary | ICD-10-CM | POA: Diagnosis not present

## 2018-03-15 DIAGNOSIS — F339 Major depressive disorder, recurrent, unspecified: Secondary | ICD-10-CM

## 2018-03-15 MED ORDER — AMOXICILLIN-POT CLAVULANATE 875-125 MG PO TABS: 1.0000 | ORAL_TABLET | Freq: Two times a day (BID) | ORAL | 0 refills | Status: AC

## 2018-03-15 NOTE — Assessment & Plan Note (Signed)
No exudates on exam and is afebrile, but tender cervical LAD. Will go ahead and treat empirically with Augmentin to cover for otitis media and bacterial pharyngitis.  Also recommended to continue with Ibuprofen and gargle warm salt water a few times a day for pain relief.  Told to return if symptoms worsen or if she develops a fever.

## 2018-03-15 NOTE — Progress Notes (Signed)
   CC: sore throat   HPI:  [name] is a 47 y.o. presents with sore throat and left ear pain. Patient developed left ear pain 5 days ago described as a throbbing ache. She took Ibuprofen with moderate relief. Her ear pain resolved, but then she developed a sore throat beginning yesterday. Pain is worse with eating and swallowing. She denies post-nasal drip, rhinorrhea, nasal congestion, cough, fevers. No sick contacts.   Past Medical History:  Diagnosis Date  . Anemia    iron deficiency  . Depression   . Fibroids    s/p TAH- right salpingo- oophorectomy in 2010  . Gardnerella vaginalis infection    recurrent  . History of tension headache   . History of tobacco abuse   . Horner's syndrome    dry eye syndrome: OS>OD 2/2 horner's syndrome. followed at [facility] center; LUL ptosis 2/2 Horner's; f/u with Dr. Levan Hurst  . Metrorrhagia    h/o treated with OCP.  Marland Kitchen Schwannoma 12/14/2008   Left sided benign Schwanoma( retropharyngeal space ) s/p resection in 01/11.       Physical Exam:  Vitals:   03/15/18 0836  BP: 131/70  Pulse: (!) 50  Temp: 98.2 F (36.8 C)  TempSrc: Oral  SpO2: 100%  Weight: 147 lb 12.8 oz (67 kg)  Height: 5\' 2"  (1.575 m)   General: alert, pleasant, appears stated age, NAD HEENT: conjunctiva non-injected; posterior oropharynx with erythema; no exudates. Right TM and ear canal normal. Left TM normal; left ear canal mildly erythematous; tenderness with manipulation of pinna and tragus.  Neck: submandibular LAD with mild TTP   Assessment & Plan:   See Encounters Tab for problem based charting.  Patient seen with Dr. Dareen Piano

## 2018-03-15 NOTE — Patient Instructions (Addendum)
Lori Murray,  It was a pleasure meeting you. We are going to treat you with antibiotics for 5 days for possible ear infection or strep throat. Please ensure to complete total course. Return if you have worsening of symptoms or develop fevers even after taking antibiotics.   Take care, and hope you feel better soon!

## 2018-03-15 NOTE — Assessment & Plan Note (Signed)
Etiology could be viral or bacterial otitis medial versus otitis externa. Reported symptomatic improvement, but was fairly tender on manipulation of tragus and pinna as well as on exam with otoscope. Treating empirically with Augmentin.

## 2018-03-17 NOTE — Addendum Note (Signed)
Addended by: Aldine Contes on: 03/17/2018 06:33 PM   Modules accepted: Level of Service

## 2018-03-17 NOTE — Progress Notes (Signed)
Internal Medicine Clinic Attending  I saw and evaluated the patient.  I personally confirmed the key portions of the history and exam documented by Dr. Koleen Distance and I reviewed pertinent patient test results.  The assessment, diagnosis, and plan were formulated together and I agree with the documentation in the resident's note.  Patient was noted to have pharyngeal erythema, no cough, tender cervical lymphadenopathy. Wll treat empirically with augmentin

## 2018-04-14 ENCOUNTER — Other Ambulatory Visit: Payer: Self-pay

## 2018-04-14 DIAGNOSIS — F339 Major depressive disorder, recurrent, unspecified: Secondary | ICD-10-CM

## 2018-04-14 NOTE — Telephone Encounter (Signed)
sertraline (ZOLOFT) 50 MG tablet(Expired), refill request @  [facility], [place] (Phone) [phone] (Fax)

## 2018-04-15 MED ORDER — SERTRALINE HCL 50 MG PO TABS: 50.0000 mg | ORAL_TABLET | Freq: Every day | ORAL | 0 refills | Status: DC

## 2018-09-24 IMAGING — CT CT ABD-PELV W/ CM
2 of 5 series · 16 of 46 positions shown, 18 images · IV contrast (APPLIED)
Comparison: CT 03/20/2014

CLINICAL DATA: Abdominal pain and rectal bleeding.

EXAM:
CT ABDOMEN AND PELVIS WITH CONTRAST
TECHNIQUE: Multidetector CT imaging of the abdomen and pelvis was performed
using the standard protocol following bolus administration of
intravenous contrast.
CONTRAST:  100 cc Isovue-TUU IV

[Series 3: abd/ pelvis 5.0 i30f 2 · axial · 0.78mm/px · z∈[+782,+1227]mm · 13 of 101 slices shown, 15 images]
[im 6/101  soft-tissue]
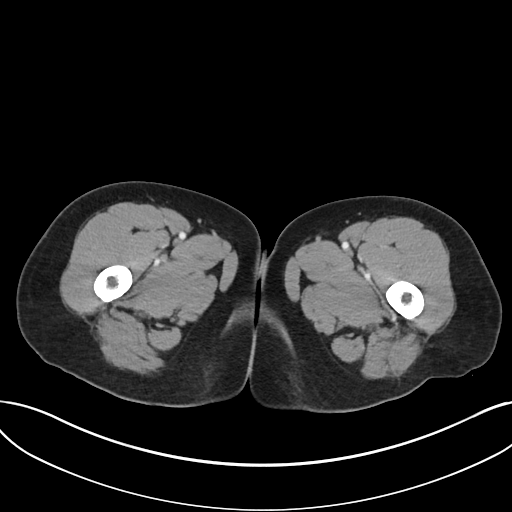
[im 6/101  bone]
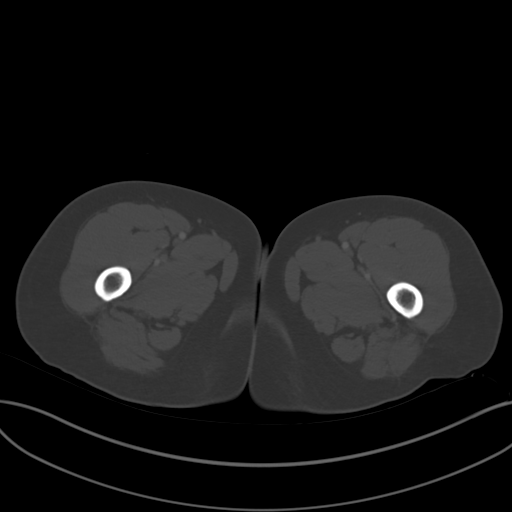
[im 12/101  soft-tissue]
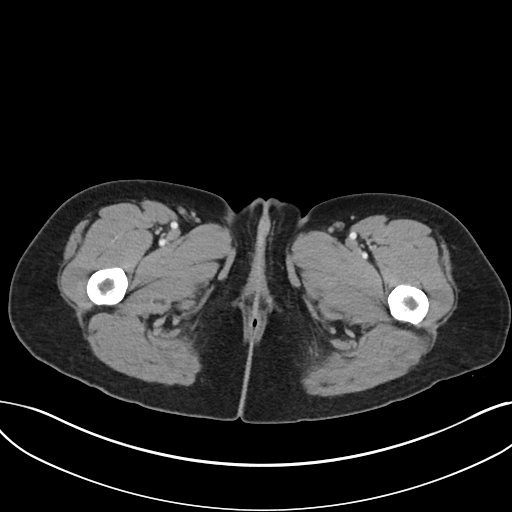
[im 23/101  soft-tissue]
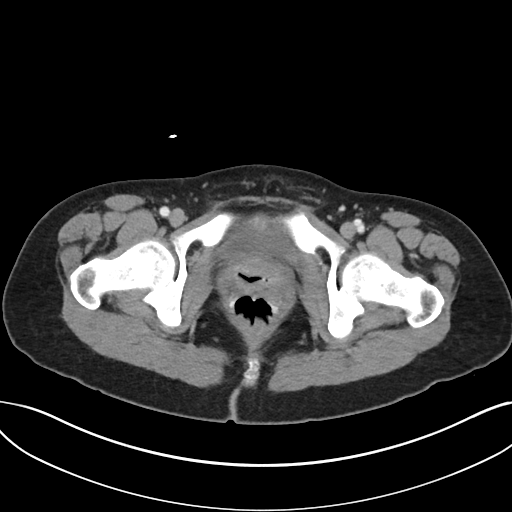
[im 28/101  soft-tissue]
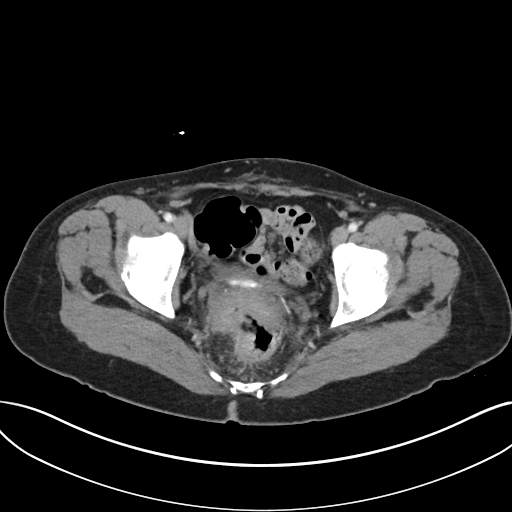
[im 34/101  soft-tissue]
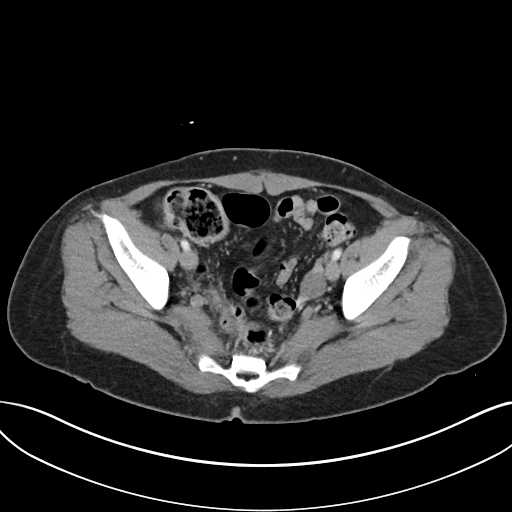
[im 45/101  soft-tissue]
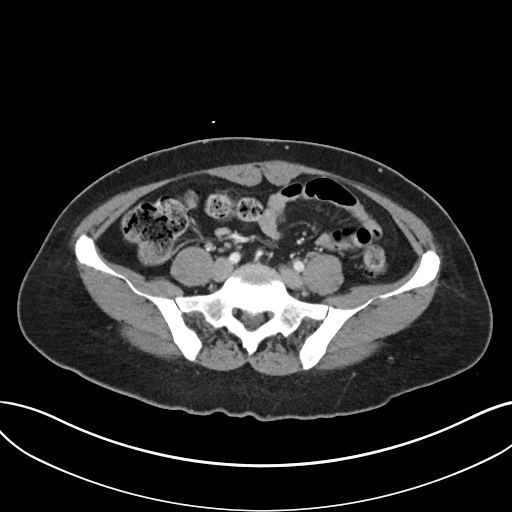
[im 51/101  soft-tissue]
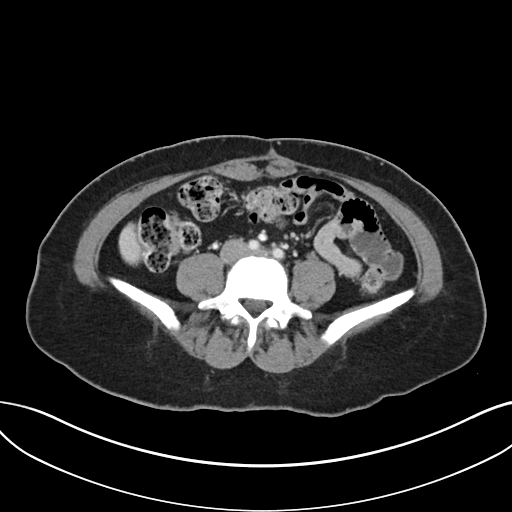
[im 56/101  soft-tissue]
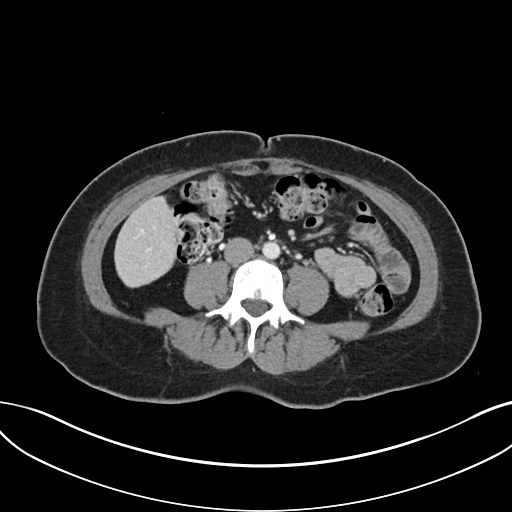
[im 67/101  soft-tissue]
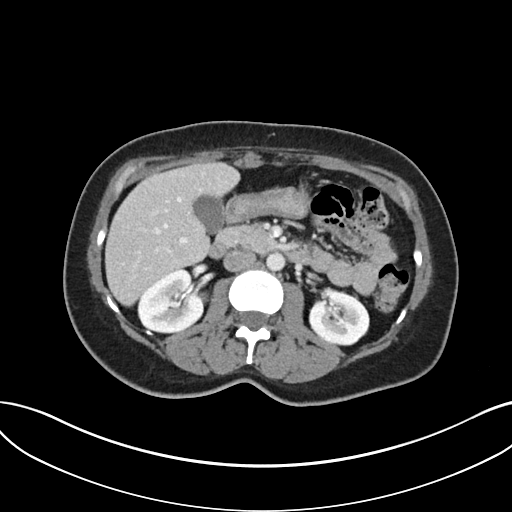
[im 67/101  bone]
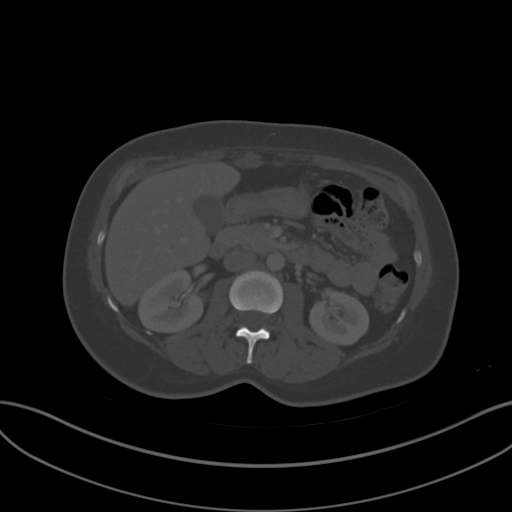
[im 73/101  soft-tissue]
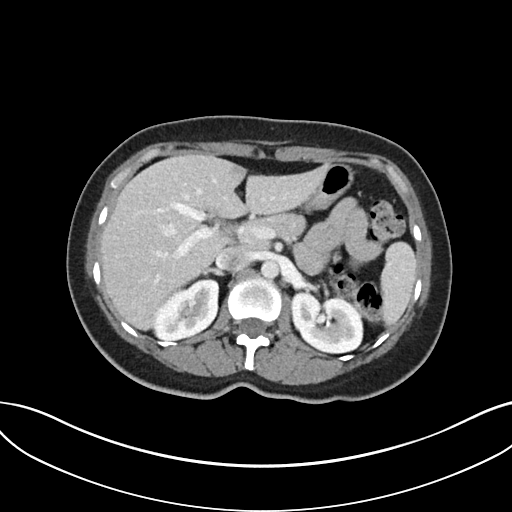
[im 78/101  soft-tissue]
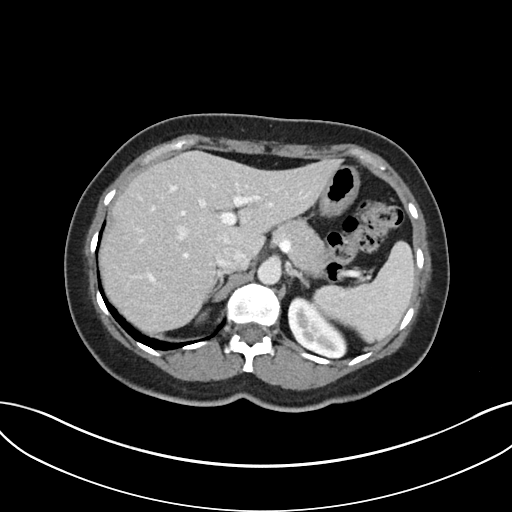
[im 89/101  soft-tissue]
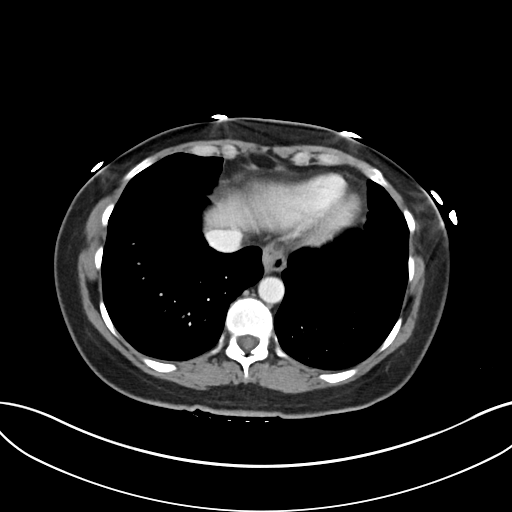
[im 95/101  soft-tissue]
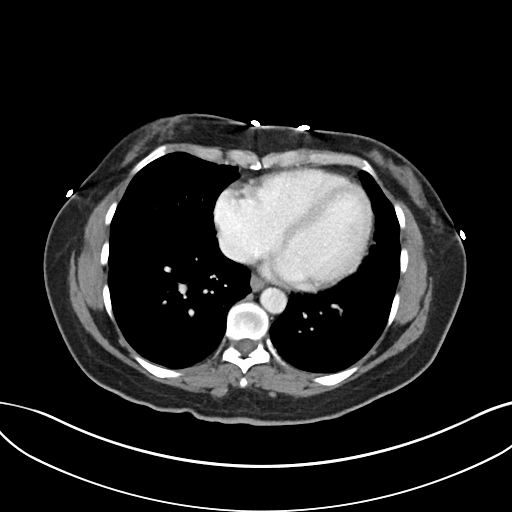

[Series 6: coronal soft tissue · coronal · 0.70mm/px · 3 of 78 slices shown]
[im 26/78  soft-tissue]
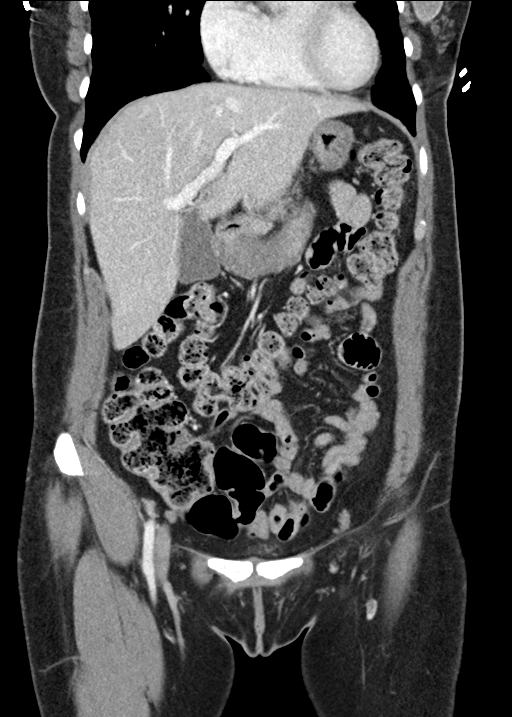
[im 35/78  soft-tissue]
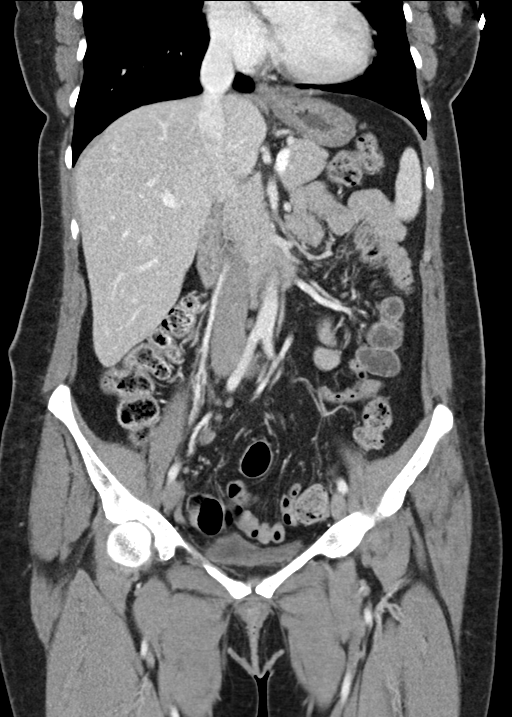
[im 43/78  soft-tissue]
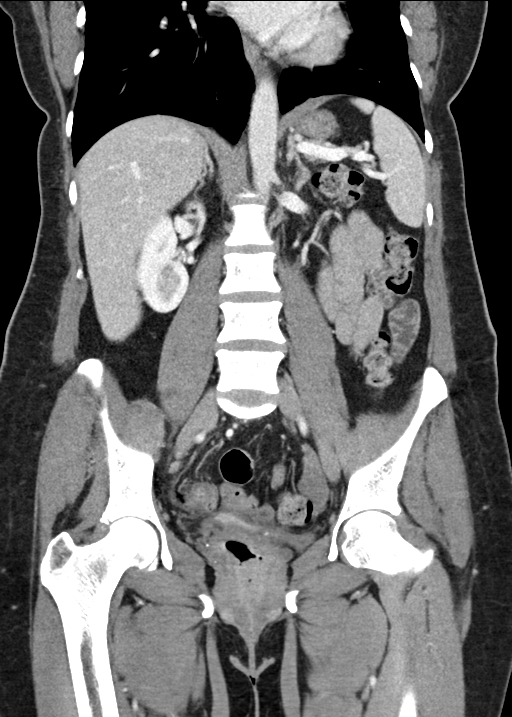

[16 of 46 positions shown; findings below may reference images not displayed]

FINDINGS: Lower chest: No consolidation or pleural fluid. Minimal dependent
atelectasis.

Hepatobiliary: No focal liver abnormality is seen. No gallstones,
gallbladder wall thickening, or biliary dilatation.

Pancreas: No ductal dilatation or inflammation.

Spleen: Normal in size. Subcentimeter subcapsular hypodensity in
posterior spleen is unchanged.

Adrenals/Urinary Tract: Normal adrenal glands. No hydronephrosis or
perinephric edema. Urinary bladder is decompressed. There is
excreted intravenous contrast in the bladder.

Stomach/Bowel: There is wall thickening of the distal sigmoid colon.
Moderate stool in the remainder of the colon. The appendix is
normal. No small bowel inflammation, wall thickening or obstruction.
Stomach is decompressed.

Vascular/Lymphatic: No significant vascular findings are present. No
enlarged abdominal or pelvic lymph nodes.

Reproductive: Left ovary is normal in size. Right ovary not
visualized. Uterus appears surgically absent. Air in the vagina.

Other: No free air, free fluid, or intra-abdominal fluid collection.

Musculoskeletal: There are no acute or suspicious osseous
abnormalities.
IMPRESSION: Short segment wall thickening in the distal sigmoid colon,
suspicious for short-segment colitis. Recommend direct visualization
with colonoscopy after resolution of acute symptoms to exclude
underlying colonic neoplasm.

## 2018-10-07 ENCOUNTER — Ambulatory Visit (INDEPENDENT_AMBULATORY_CARE_PROVIDER_SITE_OTHER): Payer: Self-pay | Admitting: Internal Medicine

## 2018-10-07 ENCOUNTER — Other Ambulatory Visit: Payer: Self-pay

## 2018-10-07 ENCOUNTER — Encounter: Payer: Self-pay | Admitting: Internal Medicine

## 2018-10-07 DIAGNOSIS — F330 Major depressive disorder, recurrent, mild: Secondary | ICD-10-CM

## 2018-10-07 DIAGNOSIS — J302 Other seasonal allergic rhinitis: Secondary | ICD-10-CM

## 2018-10-07 NOTE — Progress Notes (Signed)
  [facility] Internal Medicine Residency Telephone Encounter Continuity Care Appointment  HPI:   This telephone encounter was created for Ms. Lori Murray on 10/07/2018 for the following purpose/cc: Evaluation eye itching and follow up for depression.   Past Medical History:  Past Medical History:  Diagnosis Date  . Anemia    iron deficiency  . Depression   . Fibroids    s/p TAH- right salpingo- oophorectomy in 2010  . Gardnerella vaginalis infection    recurrent  . History of tension headache   . History of tobacco abuse   . Horner's syndrome    dry eye syndrome: OS>OD 2/2 horner's syndrome. followed at [facility] center; LUL ptosis 2/2 Horner's; f/u with Dr. Levan Hurst  . Metrorrhagia    h/o treated with OCP.  Marland Kitchen Schwannoma 12/14/2008   Left sided benign Schwanoma( retropharyngeal space ) s/p resection in 01/11.         ROS:   She denies fever, chills, HA, changes in vision, chest pain, SOB, abdominal pain, N/V, and changes in bowel movements.    Assessment / Plan / Recommendations:   Please see A&P under problem oriented charting for assessment of the patient's acute and chronic medical conditions.   As always, pt is advised that if symptoms worsen or new symptoms arise, they should go to an urgent care facility or to to ER for further evaluation.   Consent and Medical Decision Making:   Patient discussed with Dr. Dareen Piano  This is a telephone encounter between Lori Murray and Welford Roche on 10/07/2018 for medical conditions stated above. The visit was conducted with the patient located at home and Welford Roche at [facility]. The patient's identity was confirmed using their DOB and current address. The patient has consented to being evaluated through a telephone encounter and understands the associated risks (an examination cannot be done and the patient may need to come in for an appointment) / benefits (allows the patient to remain at home, decreasing  exposure to coronavirus). I personally spent 8 minutes on medical discussion.

## 2018-10-07 NOTE — Progress Notes (Signed)
Internal Medicine Clinic Attending  Case discussed with Dr. Santos-Sanchez at the time of the visit.  We reviewed the resident's history and exam and pertinent patient test results.  I agree with the assessment, diagnosis, and plan of care documented in the resident's note.    

## 2018-10-07 NOTE — Assessment & Plan Note (Signed)
Patient's symptoms are stable. She is compliant with Zoloft 50 mg QD. Will continue.

## 2018-10-07 NOTE — Assessment & Plan Note (Addendum)
Patient endorses constant eye itchigng and lacrimation associated with nasal congestion that started about 1 month ago and worsen when she is outside her house.  She has not been using anything for her symptoms. She does not use facial lotions or creams and has been using the same facial soap as usual.  She denies fevers, chills, conjunctival injection, eye discharge, shortness of breath, cough, headache, and body aches.  I suspect her symptoms are associated with seasonal allergies recommended over-the-counter decongestants and anti-histamines. Will follow.

## 2018-10-08 ENCOUNTER — Encounter: Payer: BLUE CROSS/BLUE SHIELD | Admitting: Internal Medicine

## 2018-12-07 ENCOUNTER — Telehealth: Payer: Self-pay | Admitting: *Deleted

## 2018-12-07 NOTE — Telephone Encounter (Signed)
Pt and her daughter call, pt had COVID testing 6/22, it came back +POSITIVE+ she has been quarantined since, everyone in her house tested positive. Her cough is better and no fevers. 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 She would like dr Isac Sarna to call her back at [phone] and speak to both pt and daughter Donah Driver. She will have results faxed to [facility]

## 2018-12-08 NOTE — Telephone Encounter (Signed)
Called patient x2. No answer. Will call again tomorrow.

## 2019-07-25 ENCOUNTER — Ambulatory Visit: Payer: Self-pay | Admitting: Internal Medicine

## 2019-07-25 ENCOUNTER — Other Ambulatory Visit: Payer: Self-pay

## 2019-07-25 ENCOUNTER — Encounter: Payer: Self-pay | Admitting: Internal Medicine

## 2019-07-25 VITALS — BP 137/87 | HR 66 | Temp 98.4°F | Ht 63.0 in | Wt 157.7 lb

## 2019-07-25 DIAGNOSIS — Z0000 Encounter for general adult medical examination without abnormal findings: Secondary | ICD-10-CM

## 2019-07-25 DIAGNOSIS — G4700 Insomnia, unspecified: Secondary | ICD-10-CM

## 2019-07-25 DIAGNOSIS — Z8616 Personal history of COVID-19: Secondary | ICD-10-CM | POA: Insufficient documentation

## 2019-07-25 DIAGNOSIS — N951 Menopausal and female climacteric states: Secondary | ICD-10-CM

## 2019-07-25 DIAGNOSIS — Z79899 Other long term (current) drug therapy: Secondary | ICD-10-CM

## 2019-07-25 DIAGNOSIS — F330 Major depressive disorder, recurrent, mild: Secondary | ICD-10-CM

## 2019-07-25 DIAGNOSIS — F339 Major depressive disorder, recurrent, unspecified: Secondary | ICD-10-CM

## 2019-07-25 DIAGNOSIS — F329 Major depressive disorder, single episode, unspecified: Secondary | ICD-10-CM

## 2019-07-25 HISTORY — DX: Personal history of COVID-19: Z86.16

## 2019-07-25 LAB — POCT GLYCOSYLATED HEMOGLOBIN (HGB A1C): Hemoglobin A1C: 5.5 % (ref 4.0–5.6)

## 2019-07-25 LAB — GLUCOSE, CAPILLARY: Glucose-Capillary: 84 mg/dL (ref 70–99)

## 2019-07-25 MED ORDER — SERTRALINE HCL 50 MG PO TABS: 50.0000 mg | ORAL_TABLET | Freq: Every day | ORAL | 2 refills | Status: DC

## 2019-07-25 NOTE — Progress Notes (Signed)
   CC: Depression follow up and health care maintenance  HPI:  [name] is a 49 y.o. year-old female with PMH listed below who presents to clinic for depression follow-up and healthcare maintenance. Please see problem based assessment and plan for further details.   Past Medical History:  Diagnosis Date  . Anemia    iron deficiency  . Depression   . Fibroids    s/p TAH- right salpingo- oophorectomy in 2010  . Gardnerella vaginalis infection    recurrent  . History of tension headache   . History of tobacco abuse   . Horner's syndrome    dry eye syndrome: OS>OD 2/2 horner's syndrome. followed at [facility] center; LUL ptosis 2/2 Horner's; f/u with Dr. Levan Hurst  . Metrorrhagia    h/o treated with OCP.  Marland Kitchen Schwannoma 12/14/2008   Left sided benign Schwanoma( retropharyngeal space ) s/p resection in 01/11.      Review of Systems:   Review of Systems  Constitutional: Negative for chills, fever, malaise/fatigue and weight loss.  Respiratory: Negative for shortness of breath.   Cardiovascular: Negative for chest pain.  Gastrointestinal: Negative for abdominal pain, nausea and vomiting.  Neurological: Negative for dizziness and headaches.  Psychiatric/Behavioral: Negative for depression and suicidal ideas. The patient has insomnia. The patient is not nervous/anxious.     Physical Exam:  Vitals:   07/25/19 1340  BP: 137/87  Pulse: 66  Temp: 98.4 F (36.9 C)  TempSrc: Oral  SpO2: 99%  Weight: 157 lb 11.2 oz (71.5 kg)  Height: 5\' 3"  (1.6 m)    General: Well-appearing young female in NAD Cardiac: regular rate and rhythm, nl S1/S2, no murmurs, rubs or gallops Pulm: CTAB, no wheezes or crackles, no increased work of breathing on room air  Ext: warm and well perfused, no peripheral edema Psych: Patient describes mood as very good. Affect is appropriate. Appears attentive and has appropriate behavior. Speech is normal. Concentration is good. Denies VH, AH, SI, Hi.  Judgement good.  Insight good.        Office Visit from 07/25/2019 in [place]  PHQ-9 Total Score  3       Assessment & Plan:   See Encounters Tab for problem based charting.  Patient discussed with Dr. Lynnae January

## 2019-07-25 NOTE — Assessment & Plan Note (Signed)
Patient presents for depression follow-up.  She is on sertraline 50 mg daily and compliant.  States her mood has been pretty good while on this medication.  The medicine has also been helping with menopause symptoms.  She unfortunately ran out of it 1 week ago has been experiencing worsening hot flashes and insomnia.  PHQ-9 is 3.   -Continue Zoloft 50 mg daily - Follow up in 6 mo - 1 yr

## 2019-07-25 NOTE — Patient Instructions (Addendum)
Cipriano Mile,   Le hicimos laboratorios hoy para checarle los conteos de [place], [place], Brewing technologist, riones, diabetes y [place]. En 2-3 dias la llamo para dejarle saber los resultados.   Use compresiones calientes por 10 minutos de 2 a 3 veces al dias en la pierna.   Le envie un refill de sertraline a su farmacia.   Haga una cita de seguimiento conmigo en 6 meses o antes si lo necesita.   - Dr. Frederico Hamman

## 2019-07-25 NOTE — Assessment & Plan Note (Signed)
Patient presents today for a regular follow-up visit.  She recently turned 49 years old and is concerned about developing diabetes and high cholesterol. She has not had blood work in about 2 years. Will order CBC, BMP, A1c, and lipid profile. Flu shot given today. She has been contacted by breast cancer center to schedule her mammogram.

## 2019-07-26 LAB — BMP8+ANION GAP
Anion Gap: 13 mmol/L (ref 10.0–18.0)
BUN/Creatinine Ratio: 26 — ABNORMAL HIGH (ref 9–23)
BUN: 17 mg/dL (ref 6–24)
CO2: 22 mmol/L (ref 20–29)
Calcium: 9.7 mg/dL (ref 8.7–10.2)
Chloride: 106 mmol/L (ref 96–106)
Creatinine, Ser: 0.66 mg/dL (ref 0.57–1.00)
GFR calc Af Amer: 119 mL/min/{1.73_m2} (ref >59)
GFR calc non Af Amer: 103 mL/min/{1.73_m2} (ref >59)
Glucose: 85 mg/dL (ref 65–99)
Potassium: 4.4 mmol/L (ref 3.5–5.2)
Sodium: 141 mmol/L (ref 134–144)

## 2019-07-26 LAB — LIPID PANEL
Chol/HDL Ratio: 4.6 ratio — ABNORMAL HIGH (ref 0.0–4.4)
Cholesterol, Total: 237 mg/dL — ABNORMAL HIGH (ref 100–199)
HDL: 51 mg/dL (ref >39)
LDL Chol Calc (NIH): 145 mg/dL — ABNORMAL HIGH (ref 0–99)
Triglycerides: 229 mg/dL — ABNORMAL HIGH (ref 0–149)
VLDL Cholesterol Cal: 41 mg/dL — ABNORMAL HIGH (ref 5–40)

## 2019-07-26 LAB — CBC
Hematocrit: 37.4 % (ref 34.0–46.6)
Hemoglobin: 12.4 g/dL (ref 11.1–15.9)
MCH: 30.8 pg (ref 26.6–33.0)
MCHC: 33.2 g/dL (ref 31.5–35.7)
MCV: 93 fL (ref 79–97)
Platelets: 264 10*3/uL (ref 150–450)
RBC: 4.03 x10E6/uL (ref 3.77–5.28)
RDW: 12.6 % (ref 11.7–15.4)
WBC: 7.7 10*3/uL (ref 3.4–10.8)

## 2019-07-27 NOTE — Progress Notes (Signed)
Internal Medicine Clinic Attending  Case discussed with Dr. Santos at the time of the visit.  We reviewed the resident's history and exam and pertinent patient test results.  I agree with the assessment, diagnosis, and plan of care documented in the resident's note.    

## 2019-09-24 ENCOUNTER — Ambulatory Visit: Payer: Self-pay | Attending: Internal Medicine

## 2019-09-24 DIAGNOSIS — Z23 Encounter for immunization: Secondary | ICD-10-CM

## 2019-09-24 NOTE — Progress Notes (Signed)
   Covid-19 Vaccination Clinic  Name:  Lori Murray    MRN: [id number] DOB: [birth year]  09/24/2019  [name] was observed post Covid-19 immunization for 15 minutes without incident. She was provided with Vaccine Information Sheet and instruction to access the V-Safe system.   [name] was instructed to call 911 with any severe reactions post vaccine: Marland Kitchen Difficulty breathing  . Swelling of face and throat  . A fast heartbeat  . A bad rash all over body  . Dizziness and weakness   Immunizations Administered    Name Date Dose VIS Date Route   Pfizer COVID-19 Vaccine 09/24/2019  8:41 AM 0.3 mL 05/20/2019 Intramuscular   Manufacturer: [place]   Lot: [id number]   [place]: [id number]

## 2019-10-04 ENCOUNTER — Encounter: Payer: Self-pay | Admitting: *Deleted

## 2019-10-18 ENCOUNTER — Ambulatory Visit: Payer: Self-pay | Attending: Internal Medicine

## 2019-10-18 DIAGNOSIS — Z23 Encounter for immunization: Secondary | ICD-10-CM

## 2019-10-18 NOTE — Progress Notes (Signed)
   Covid-19 Vaccination Clinic  Name:  Otta Osipov    MRN: [id number] DOB: 11/13/1970  10/18/2019  Ms. Dattilo was observed post Covid-19 immunization for 15 minutes without incident. She was provided with Vaccine Information Sheet and instruction to access the V-Safe system.   Ms. Downs was instructed to call 911 with any severe reactions post vaccine: Marland Kitchen Difficulty breathing  . Swelling of face and throat  . A fast heartbeat  . A bad rash all over body  . Dizziness and weakness   Immunizations Administered    Name Date Dose VIS Date Route   Pfizer COVID-19 Vaccine 10/18/2019  8:46 AM 0.3 mL 08/03/2018 Intramuscular   Manufacturer: [employer], [employer]   Lot: [id number]   [place]: [id number]     Interpreter # [id number]

## 2020-03-01 ENCOUNTER — Other Ambulatory Visit: Payer: Self-pay

## 2020-03-01 ENCOUNTER — Ambulatory Visit (INDEPENDENT_AMBULATORY_CARE_PROVIDER_SITE_OTHER): Payer: Self-pay | Admitting: Student

## 2020-03-01 ENCOUNTER — Ambulatory Visit (HOSPITAL_COMMUNITY)
Admission: RE | Admit: 2020-03-01 | Discharge: 2020-03-01 | Disposition: A | Discharge status: Home / Self Care | Payer: Self-pay | Source: Ambulatory Visit | Attending: Internal Medicine | Admitting: Internal Medicine

## 2020-03-01 ENCOUNTER — Encounter: Payer: Self-pay | Admitting: Student

## 2020-03-01 VITALS — BP 129/77 | HR 67 | Temp 97.5°F | Ht 63.0 in | Wt 155.0 lb

## 2020-03-01 DIAGNOSIS — M7918 Myalgia, other site: Secondary | ICD-10-CM

## 2020-03-01 DIAGNOSIS — R079 Chest pain, unspecified: Secondary | ICD-10-CM

## 2020-03-01 DIAGNOSIS — R0789 Other chest pain: Secondary | ICD-10-CM | POA: Insufficient documentation

## 2020-03-01 DIAGNOSIS — M792 Neuralgia and neuritis, unspecified: Secondary | ICD-10-CM

## 2020-03-01 HISTORY — DX: Myalgia, other site: M79.18

## 2020-03-01 HISTORY — DX: Other chest pain: R07.89

## 2020-03-01 MED ORDER — ACETAMINOPHEN 325 MG PO TABS: 650.0000 mg | ORAL_TABLET | ORAL | 2 refills | Status: AC | PRN

## 2020-03-01 MED ORDER — GABAPENTIN 100 MG PO CAPS: 100.0000 mg | ORAL_CAPSULE | Freq: Two times a day (BID) | ORAL | 0 refills | Status: DC

## 2020-03-01 NOTE — Assessment & Plan Note (Signed)
Patient reports intermittent, mid-sternal chest pain over the past few months. Describes the chest pain as pressure-like, as if someone was pushing on her chest. Pain associated with shortness of breath, diaphoresis, and sometimes left arm pain. She says she has these episodes last a few minutes with complete resolution. States they are random and have no association with exertion or rest. Mentions they occur a few times per week. Patient previously smoked cigarettes, mentions she started smoking when she was 49 years old, quit 5 years ago. Unclear on # cigarettes per day. Also states her grandfather had heart disease.  A/P: -EKG in clinic with normal sinus rhythm, no S-T elevations or depressions -Will refer for stress test -Discussed with patient if pain does not alleviate, she should immediately go to the Emergency Department.  -Will follow-up results of stress test and see in clinic in three months.

## 2020-03-01 NOTE — Assessment & Plan Note (Addendum)
Patient reports two days of pain on the left-side of her abdomen, left thigh, and left arm. She says she was reaching up to grab something with her right arm when she suddenly felt left-sided abdominal pain. Over the past two days she says the pain has also occurred through her left arm and left thigh. She has taken some Tylenol, which has relieved some of the pain. Also mentions she has experienced tingling in the affected areas. Today, she says only her thigh pain is prevalent. Describes the thigh pain as squeezing. During this time she also endorses intermittent dizziness and blurry vision during movement. No association with standing up. Denies falls, loss of consciousness. Over the past two days denies chest pain, weakness, numbness, shortness of breath, diarrhea, vomiting, nausea, diaphoresis, fevers. No recent illness. Mentions she had both her COVID vaccines back in May.   A/P: -On exam, non-tender on left arm and left side of abdomen. Left thigh exquisitely tender to light touch. No skin changes visible. Neuro exam without abnormalities.  -Presentation possibly muscle vs nerve strain, although constellation of symptoms does not correlate with one muscle group or nerve distribution. Left thigh hypersensitive to touch, appears to be more nerve v skin involvement. Can consider myositis, although symptoms occurred abruptly after event instead of gradual onset. Patient does has history of intermittent chest pain, but exam not consistent with vascular etiology. Neuro exam grossly normal, symptom distribution not consistent with CVA. -Given etiology is unclear, will treat symptoms. Discussed with patient if symptoms worsen or do not improve in a week, to make another appointment with [place]. -Tylenol 650mg , four times per day as needed -Gabapentin 100mg , once nightly, can titrate up to two times per day as needed.

## 2020-03-01 NOTE — Progress Notes (Signed)
   CC: abdominal, thigh pain  HPI:  [name] is a 49 y.o. with medical history as listed below who presents with 2 days of left-sided abdominal pain and left thigh pain.  Please see problem-based list for further evaluation and details.  Past Medical History:  Diagnosis Date  . Anemia    iron deficiency  . Depression   . Fibroids    s/p TAH- right salpingo- oophorectomy in 2010  . Gardnerella vaginalis infection    recurrent  . History of tension headache   . History of tobacco abuse   . Horner's syndrome    dry eye syndrome: OS>OD 2/2 horner's syndrome. followed at [facility] center; LUL ptosis 2/2 Horner's; f/u with Dr. Levan Hurst  . Metrorrhagia    h/o treated with OCP.  Marland Kitchen Schwannoma 12/14/2008   Left sided benign Schwanoma( retropharyngeal space ) s/p resection in 01/11.      Review of Systems:  As per HPI.  Physical Exam:  Vitals:   03/01/20 0953  BP: 129/77  Pulse: 67  Temp: (!) 97.5 F (36.4 C)  TempSrc: Oral  SpO2: 100%  Weight: 155 lb (70.3 kg)  Height: 5\' 3"  (1.6 m)   General: Pleasant, sitting in chair, no acute distress CV: Regular rate, rhythm. No murmurs, gallops, rubs. Pulm: Clear to auscultation bilaterally. No wheezing, crackles. MSK: Exquisitely tender to light palpation on medial left thigh. Also tender to light palpation on lateral left thigh. Non-tender on right thigh, left arm, or right arm. No obvious deformities or skin changes on all extremities. Neuro: Alert and oriented x4. No focal neurological deficits appreciated. Sensation, strength in tact in all four extremities.  Assessment & Plan:   See Encounters Tab for problem based charting.  Patient seen with Dr. Dareen Piano

## 2020-03-01 NOTE — Patient Instructions (Addendum)
Almeta Monas,   Me alegre de verle hoy!  Hoy hablamos del dolor en el muslo y el abdomen. Contine tomando Tylenol, 650 mg cada cuatro horas segn sea necesario. Tambin estamos recetando gabapentina, que ayuda con el dolor de los nervios. Empiece a tomar 1 comprimido por la noche [employer]. Si contina sintiendo Theatre stage manager, tambin puede tomar un comprimido por la [place]. Recuerde que esto puede causar somnolencia, as que tngalo en cuenta antes de tomarlo por la maana.  Si el dolor no mejora en 7 das, vuelva a vernos a continuacin.  Tambin hablamos de su reciente dolor en el pecho. Hoy nos haremos un electrocardiograma, que nos ayudar a mostrarnos cmo est hoy su corazn. Estamos solicitando una prueba de esfuerzo para observar ms a fondo su corazn. Recibir una llamada para programar esto.  Estamos deseando verle la proxima vez. [place] a [phone] si tiene preguntas o preocupaciones. El mejor tiempo para llamar es lunes a viernes, 9:00am - 4:00pm, pero hay alguien esta a su disposcion para lo que halga falta a cualquier hora. Si necesita recambio de los [employer] , llama su farmacia una semana antes de fecha de finalizacion de la receta. La farmacia nos llamara para la solicitud.  Gracias que nos permite tomar parte en su asistencia medica. Deseamos lo mejor!  Clayburn Pert, Dr. Sanjuan Dame, MD   Ms. Pitones  It was a pleasure seeing you today!  Today we discussed the pain in your thigh and abdomen. Continue taking Tylenol, 650mg  every four hours as needed. We are also prescribing gabapentin, which helps with nerve pain. Start taking 1 tablet at night every day. If you continue to have pain during the day, you can take one tablet in the morning as well. Please remember that this can cause sleepiness, so keep that in mind before taking it in the morning.  If the pain does not improve within 7 days, please come back and see [place] next  week.  We also discussed your recent chest pain. Today we will get an EKG, which will help show [place] how your heart is doing today. We are ordering a stress test to further look at your heart. You will receive a call to schedule this.  We look forward to seeing you next time. Please call our clinic at [phone] if you have any questions or concerns. The best time to call is Monday-Friday from 9am-4pm, but there is someone available 24/7 at the same number. If you need medication refills, please notify your pharmacy one week in advance and they will send [place] a request.  Thank you for letting us take part in your care. Wishing you the best!  Thank you, Dr. Sanjuan Dame, MD

## 2020-03-02 NOTE — Progress Notes (Signed)
Internal Medicine Clinic Attending  I saw and evaluated the patient.  I personally confirmed the key portions of the history and exam documented by Dr. Braswell and I reviewed pertinent patient test results.  The assessment, diagnosis, and plan were formulated together and I agree with the documentation in the resident's note.  

## 2020-06-15 ENCOUNTER — Encounter: Payer: Self-pay | Admitting: Student

## 2020-06-21 ENCOUNTER — Telehealth: Payer: Self-pay

## 2020-06-21 NOTE — Telephone Encounter (Signed)
Received TC from patient who states she has an appt to receive the Covid vaccine tomorrow, but she is sick and wants to know if she should still get the vaccine.   Pt c/o congestion and sinus pressure, states she does not feel well.  She denies SOB, denies chest pain. Pt instructed not to get the Covid vaccine tomorrow and informed she needs to get a covid test.  She was given the number and information to make an appt at testing sites.  She was told to isolate until she received her results.  Pt also instructed if she develops any SOB or chest pain to seek emergency care at ED or urgent care, she verbalized understanding. SChaplin, RN,BSN

## 2020-06-21 NOTE — Telephone Encounter (Signed)
I agree, thank you.

## 2020-07-05 ENCOUNTER — Ambulatory Visit: Payer: Self-pay | Admitting: Student

## 2020-07-05 ENCOUNTER — Encounter: Payer: Self-pay | Admitting: Student

## 2020-07-05 VITALS — BP 137/77 | HR 63 | Temp 98.4°F | Wt 164.3 lb

## 2020-07-05 DIAGNOSIS — R42 Dizziness and giddiness: Secondary | ICD-10-CM

## 2020-07-05 DIAGNOSIS — L304 Erythema intertrigo: Secondary | ICD-10-CM

## 2020-07-05 DIAGNOSIS — F330 Major depressive disorder, recurrent, mild: Secondary | ICD-10-CM

## 2020-07-05 DIAGNOSIS — Z23 Encounter for immunization: Secondary | ICD-10-CM

## 2020-07-05 DIAGNOSIS — Z0000 Encounter for general adult medical examination without abnormal findings: Secondary | ICD-10-CM

## 2020-07-05 DIAGNOSIS — M19049 Primary osteoarthritis, unspecified hand: Secondary | ICD-10-CM

## 2020-07-05 DIAGNOSIS — R0789 Other chest pain: Secondary | ICD-10-CM

## 2020-07-05 LAB — POCT GLYCOSYLATED HEMOGLOBIN (HGB A1C): Hemoglobin A1C: 5.4 % (ref 4.0–5.6)

## 2020-07-05 LAB — GLUCOSE, CAPILLARY: Glucose-Capillary: 89 mg/dL (ref 70–99)

## 2020-07-05 MED ORDER — CLOTRIMAZOLE-BETAMETHASONE 1-0.05 % EX CREA: TOPICAL_CREAM | CUTANEOUS | 1 refills | Status: AC

## 2020-07-05 NOTE — Patient Instructions (Signed)
Lori Murray,   It was a pleasure meeting you today and I am glad to hear you are taking the best care you can of your health!   We will check blood work to see why you may be experiencing dizziness upon standing. I have prescribed a cream to your pharmacy for your rash. Please apply twice daily and keep the area dry. You may use this for 4 weeks. Please call if your rash does not improve or worsens.   I would also recommend you stop taking your pregnancy vitamins for now as you have had elevated vitamin levels in the past.   I will call you with results from your blood work.   Please schedule a follow up appointment with me in 3 months. [phone] or sooner for any concerns.   Thank you and take care!  Dr. Konrad Penta   ---------  Lori Murray. Lori Murray,  Fue un placer conocerlo hoy y me Engineer, water saber que est cuidando su salud lo mejor que puede!  Revisaremos el anlisis de sangre para ver por qu puede estar experimentando [employer] al ponerse de pie. Le he recetado una crema a su farmacia para su erupcin. [place] y mantener el rea seca. Puede usar esto durante 4 semanas. Por favor llame si su sarpullido no mejora o empeora.  Tambin le recomendara que deje de tomar sus vitaminas para el embarazo por ahora, ya que ha tenido niveles elevados de vitaminas en el pasado.  Te llamar con los resultados de tu anlisis de [place].  Por favor programe una cita de seguimiento conmigo en 3 meses. [phone] o antes para cualquier inquietud.  Gracias y tenga cuidado!  Dr. Elizebeth Koller Dizziness Los mareos son un problema muy frecuente. Causan sensacin de inestabilidad o de desvanecimiento. Puede sentir que se va a desmayar. Los [employer] pueden provocarle una lesin si se tropieza o se cae. La causa puede deberse a [employer], tales como los siguientes:  Medicamentos.  No tener suficiente agua en el cuerpo (deshidratacin).  Enfermedad. Siga estas  indicaciones en su casa: Comida y bebida  Beba suficiente lquido para mantener el pis (orina) claro o de color amarillo plido. Esto evita la deshidratacin. Trate de beber ms lquidos transparentes, como agua.  No beba alcohol.  Limite la cantidad de cafena que bebe o come si el mdico se lo indica.  Limite la cantidad de sal (sodio) que bebe o come si el mdico se lo indica.   Actividad  Evite los movimientos rpidos. ? Cuando se levante de una silla, sujtese hasta sentirse bien. ? Por la maana, sintese primero a un lado de la cama. Cuando se sienta bien, pngase lentamente de pie mientras se sostiene de algo. Haga esto hasta que se sienta seguro en cuanto al equilibrio.  Mueva las piernas con frecuencia si debe estar de pie en un lugar durante mucho tiempo. Mientras est de pie, contraiga y relaje los msculos de las piernas.  No conduzca vehculos ni opere maquinaria pesada si se siente mareado.  Evite agacharse si se siente mareado. En su casa, coloque los objetos en algn lugar que le resulte fcil alcanzarlos sin agacharse.   Estilo de vida  No consuma ningn producto que contenga nicotina o tabaco, como cigarrillos y Psychologist, sport and exercise. Si necesita ayuda para dejar de fumar, consulte al mdico.  Intente bajar el nivel de estrs. Para hacerlo, puede usar mtodos como el yoga o la meditacin. Hable con el mdico si necesita ayuda. Instrucciones  generales  Controle sus mareos para ver si hay cambios.  Tome los medicamentos de venta libre y los recetados solamente como se lo haya indicado el mdico. Hable con el mdico si cree que la causa de sus mareos es algn medicamento que est tomando.  Infrmele a un amigo o a un familiar si se siente mareado. [place] a esta persona que llame al mdico si observa cambios en su comportamiento.  Concurra a todas las visitas de control como se lo haya indicado el mdico. Esto es importante. Comunquese con un mdico si:  Los  [place].  Los [employer] o la sensacin de Engineer, petroleum.  Siente malestar estomacal (nuseas).  Tiene problemas para escuchar.  Aparecen nuevos sntomas.  Siente inestabilidad al estar de pie.  Siente que la Development worker, international aid vueltas. Solicite ayuda de inmediato si:  Vomita o tiene heces acuosas (diarrea), y no puede comer o beber nada.  Tiene dificultad para hacer lo siguiente: ? Hablar. ? Caminar. ? Tragar. ? Usar los brazos, las [place] piernas.  Se siente constantemente dbil.  No piensa con claridad o tiene dificultad para armar oraciones. Es posible que un amigo o un familiar adviertan que esto ocurre.  Tiene los siguientes sntomas: ? Tourist information centre manager. ? Dolor en el vientre (abdomen). ? Falta de aire. ? Sudoracin.  Cambios en la visin.  Sangrado.  Dolor de cabeza muy intenso.  Dolor o rigidez en el cuello.  Cristy Hilts. Estos sntomas pueden Sales executive. No espere hasta que los sntomas desaparezcan. Solicite atencin mdica de inmediato. Comunquese con el servicio de emergencias de su localidad (911 en los [place]). No conduzca por sus propios medios [employer]. Resumen  Los mareos causan sensacin de inestabilidad o de desvanecimiento. Puede sentir que se va a desmayar.  Beba suficiente lquido para mantener el pis (orina) claro o de color amarillo plido. No beba alcohol.  Evite los movimientos rpidos si se siente mareado.  Controle sus mareos para ver si hay cambios. Esta informacin no tiene Marine scientist el consejo del mdico. Asegrese de hacerle al mdico cualquier pregunta que tenga. Document Revised: 11/27/2016 Document Reviewed: 11/27/2016 Elsevier Patient Education  [place].

## 2020-07-06 DIAGNOSIS — R42 Dizziness and giddiness: Secondary | ICD-10-CM | POA: Insufficient documentation

## 2020-07-06 DIAGNOSIS — M1990 Unspecified osteoarthritis, unspecified site: Secondary | ICD-10-CM | POA: Insufficient documentation

## 2020-07-06 DIAGNOSIS — L304 Erythema intertrigo: Secondary | ICD-10-CM | POA: Insufficient documentation

## 2020-07-06 HISTORY — DX: Dizziness and giddiness: R42

## 2020-07-06 HISTORY — DX: Erythema intertrigo: L30.4

## 2020-07-06 LAB — CBC WITH DIFFERENTIAL/PLATELET
Basophils Absolute: 0 10*3/uL (ref 0.0–0.2)
Basos: 1 %
EOS (ABSOLUTE): 0.2 10*3/uL (ref 0.0–0.4)
Eos: 3 %
Hematocrit: 41 % (ref 34.0–46.6)
Hemoglobin: 13.2 g/dL (ref 11.1–15.9)
Immature Grans (Abs): 0 10*3/uL (ref 0.0–0.1)
Immature Granulocytes: 0 %
Lymphocytes Absolute: 2.4 10*3/uL (ref 0.7–3.1)
Lymphs: 37 %
MCH: 29.3 pg (ref 26.6–33.0)
MCHC: 32.2 g/dL (ref 31.5–35.7)
MCV: 91 fL (ref 79–97)
Monocytes Absolute: 0.4 10*3/uL (ref 0.1–0.9)
Monocytes: 6 %
Neutrophils Absolute: 3.5 10*3/uL (ref 1.4–7.0)
Neutrophils: 53 %
Platelets: 320 10*3/uL (ref 150–450)
RBC: 4.5 x10E6/uL (ref 3.77–5.28)
RDW: 13 % (ref 11.7–15.4)
WBC: 6.6 10*3/uL (ref 3.4–10.8)

## 2020-07-06 LAB — BMP8+ANION GAP
Anion Gap: 15 mmol/L (ref 10.0–18.0)
BUN/Creatinine Ratio: 27 — ABNORMAL HIGH (ref 9–23)
BUN: 17 mg/dL (ref 6–24)
CO2: 22 mmol/L (ref 20–29)
Calcium: 9.3 mg/dL (ref 8.7–10.2)
Chloride: 103 mmol/L (ref 96–106)
Creatinine, Ser: 0.63 mg/dL (ref 0.57–1.00)
GFR calc Af Amer: 120 mL/min/{1.73_m2} (ref >59)
GFR calc non Af Amer: 104 mL/min/{1.73_m2} (ref >59)
Glucose: 89 mg/dL (ref 65–99)
Potassium: 4.4 mmol/L (ref 3.5–5.2)
Sodium: 140 mmol/L (ref 134–144)

## 2020-07-06 LAB — FERRITIN: Ferritin: 231 ng/mL — ABNORMAL HIGH (ref 15–150)

## 2020-07-06 LAB — IRON AND TIBC
Iron Saturation: 19 % (ref 15–55)
Iron: 66 ug/dL (ref 27–159)
Total Iron Binding Capacity: 339 ug/dL (ref 250–450)
UIBC: 273 ug/dL (ref 131–425)

## 2020-07-06 NOTE — Assessment & Plan Note (Signed)
Patient endorses mild pain and evening stiffness in the joints of her hands including wrists and DIP joints, with Heberden nodules and otherwise unremarkable exam. Symptoms worse at night.   - Most likely represents osteoarthritis  - Continue tylenol 650mg  PRN for severe pain

## 2020-07-06 NOTE — Progress Notes (Signed)
   CC: Dizziness  HPI:  Lori Murray is a 50 y.o. lady w/ remote hx L-sided benign schwannoma of the L sympathetic chain s/p resection 06/2009, AUB w/ IDA s/p hysterectomy, Depression, and COVID-19 infection that did not require hospitalization 3 weeks ago, presenting for routine follow up with chief complaint of dizziness, also rash between her breasts and bilateral hand pain. She states her grandmother recently died and this motivated her to desire the best care for herself. She says her dizziness has been ongoing for about 2 months, described as an "unsteadiness", worst upon standing up. She says she felt similar when she had iron deficiency anemia in 2010 before her hysterectomy. She notes that her hands fall asleep on her at night and endorses pain of her knuckles and wrists, worse at night. States she recently started wearing a new bra that causes friction/moisture between her breasts and had noticed an itchy rash in this area a couple weeks ago that improved with hydrocortisone cream, leaving only redness currently. States that the atypical CP she presented with last visit only occurred over 1 month without any recurrence and is not interested in follow up for this. Denies syncope, falls, headaches, new weakness or numbness, fevers, chills, abdominal pain, or any other symptoms.   Patient prefers not to utilize Spanish interpretor services.   Past Medical History:  Diagnosis Date  . Anemia    iron deficiency  . Depression   . Fibroids    s/p TAH- right salpingo- oophorectomy in 2010  . Gardnerella vaginalis infection    recurrent  . History of tension headache   . History of tobacco abuse   . Horner's syndrome    dry eye syndrome: OS>OD 2/2 horner's syndrome. followed at [facility] center; LUL ptosis 2/2 Horner's; f/u with Dr. Levan Hurst  . Metrorrhagia    h/o treated with OCP.  Marland Kitchen Schwannoma 12/14/2008   Left sided benign Schwanoma( retropharyngeal space ) s/p resection in 01/11.       Review of Systems:  All others negative except as noted above in HPI.   Physical Exam:  Vitals:   07/05/20 0957  BP: 137/77  Pulse: 63  Temp: 98.4 F (36.9 C)  TempSrc: Oral  SpO2: 99%  Weight: 164 lb 4.8 oz (74.5 kg)   General: Patient appears well. No acute distress. Eyes: Sclera non-icteric. No conjunctival injection.  HENT: MMM. No nasal discharge. Respiratory: Lungs are CTA, bilaterally. No wheezes, rales, or rhonchi.  Cardiovascular: Regular rate and rhythm. No murmurs, rubs, or gallops. No lower extremity edema. Abdominal: Soft and non-tender to palpation. Bowel sounds intact. No rebound or guarding. Neurological: CN II-XII intact. Alert and oriented x 3. No tremors. No nystagmus. Musculoskeletal: There are heberden's nodes of the DIP joints of the hands. No ulnar deviation or tenderness of the hands or wrists. Strength is 5/5 in bilateral upper extremities. Normal muscle bulk and tone.  Skin: There is a ~ 2 x 4cm slightly raised area of erythema without ring-enhancement, scaling, papules or drainage. No underlying fluctuance.  Psych: Normal affect. Normal tone of voice.   Assessment & Plan:   See Encounters Tab for problem based charting.  Patient discussed with Dr. Daryll Drown.  Jeralyn Bennett, MD 07/06/2020, 6:44 PM Pager: [phone]

## 2020-07-06 NOTE — Assessment & Plan Note (Signed)
Patient has a history of mild, recurrent MDD for which she continues to take Zoloft. States symptoms are currently under good control and is not interested in counseling at this time.  - Continue Zoloft 50mg  daily

## 2020-07-06 NOTE — Assessment & Plan Note (Addendum)
Patient vaccinated against influenza today.  - Will need to address need for COVID-19 booster - States she has mammography coming up soon

## 2020-07-06 NOTE — Assessment & Plan Note (Addendum)
Endorses feelings of unsteadiness upon standing, not at rest and not with exertion without room-spinning vertigo intermittently over the past 2 months. Felt similarly when she had iron deficiency anemia in 2010 before her hysterectomy for AUB. Orthostatic vitals unremarkable today. Denies other FND's and Neurological exam unremarkable. Did have atypical CP last visit and did not follow up with stress test, although denies recurrence of symptoms and is not interested in follow up at this time. Does not sound cardiac in nature.   Iron studies today show borderline low iron saturation of 19% although iron level is normal and ferritin is slightly high at 231; however, may be falsely elevated in the setting of COVID-19 infection recently. Could represent post-COVID syndrome. Patient is taking prenatal vitamins daily. Consider possibility of vitamin excess. Also consider possibility of allergies although denies nasal symptoms vs. recurrence of schwannoma given history, especially since she had symptoms of intermittent fullness behind her ear in 2019 and did not end up receiving brain MRI. Zoloft and gabapentin not likely contributing as patient has been on these for much longer. CBC with differential, BMP, Hgb A1c unremarkable.  - Stop prenatal vitamin and monitor for improvement - Start OTC ferrous sulfate daily and follow up iron labs in 3 months  - Will refer to financial advisor to be sure patient obtains insurance  - Consider future MRI if symptoms concerning for schwannoma

## 2020-07-06 NOTE — Assessment & Plan Note (Signed)
Last visit, patient noted to have atypical CP. Referred for stress test, but did not follow up. Patient states symptoms rarely occurred over 1 month, not associated with exertion without other symptoms (depsite previous note), and states she is not interested in following this up currently.   - Continue to monitor for symptoms

## 2020-07-06 NOTE — Assessment & Plan Note (Addendum)
Patient endorses itchy, erythematous rash between and under breasts that has significantly improved with hydrocortisone cream. Does have persistent erythema between breasts.   - Start Lotrisone cream twice daily x 2-4 weeks  - Encouraged keeping the area dry and switching bras if continues to be bothersome

## 2020-07-11 NOTE — Progress Notes (Signed)
Internal Medicine Clinic Attending  Case discussed with Dr. Speakman  At the time of the visit.  We reviewed the resident's history and exam and pertinent patient test results.  I agree with the assessment, diagnosis, and plan of care documented in the resident's note.  

## 2020-07-12 ENCOUNTER — Telehealth: Payer: Self-pay | Admitting: Student

## 2020-07-12 DIAGNOSIS — D509 Iron deficiency anemia, unspecified: Secondary | ICD-10-CM

## 2020-07-12 MED ORDER — POLYSACCHARIDE IRON COMPLEX 150 MG PO CAPS: 150.0000 mg | ORAL_CAPSULE | ORAL | 0 refills | Status: DC

## 2020-07-12 NOTE — Telephone Encounter (Addendum)
Attempted to call patient regarding her lab results with no answer nor voicemail. Iron studies returned with likely mixed IDA and anemia of chronic disease. Will prescribe Nu Iron 150mg  every other day to pharmacy and offer OTC ferrous sulfate if unable to afford this.   Addendum 07/13/20: Was able to reach patient by phone and informed her of above. Will pick up Nu Iron prescription and schedule follow up appointment in 3 months.  Jeralyn Bennett, MD 07/12/2020, 6:58 PM Pager: [phone]

## 2020-07-13 ENCOUNTER — Telehealth: Payer: Self-pay | Admitting: Student

## 2020-07-13 NOTE — Telephone Encounter (Signed)
Opened in error.  Jeralyn Bennett, MD 07/13/2020, 9:22 AM Pager: [phone]

## 2020-10-03 ENCOUNTER — Other Ambulatory Visit: Payer: Self-pay

## 2020-10-03 DIAGNOSIS — F339 Major depressive disorder, recurrent, unspecified: Secondary | ICD-10-CM

## 2020-10-03 MED ORDER — SERTRALINE HCL 50 MG PO TABS: 50.0000 mg | ORAL_TABLET | Freq: Every day | ORAL | 2 refills | Status: DC

## 2020-11-01 ENCOUNTER — Encounter: Payer: Self-pay | Admitting: *Deleted

## 2020-12-11 ENCOUNTER — Encounter: Payer: Self-pay | Admitting: *Deleted

## 2021-01-30 ENCOUNTER — Telehealth: Payer: Self-pay | Admitting: *Deleted

## 2021-01-30 NOTE — Telephone Encounter (Addendum)
Call from pt stating she does not feel good; requesting an appt for tomorrow. Stated she she has pain her chest when she is lying down, non- radiating; difficult to sleep. I asked if she has a fever - stated yesterday but not today; she's taking dayquil and nyquil. Sweating at night. Coughing. I asked if she has been tested for covid - she stated no but she does have a home test. Pt was able to do the at home covid test - called pt back, it came back negative. Telehealth appt schedule for tomorrow morning at 0945 AM. Pt instructed to go to ER if pain in her chest worsens; stated she understands. Telehealth or in-per appt please advise?

## 2021-01-31 ENCOUNTER — Ambulatory Visit (INDEPENDENT_AMBULATORY_CARE_PROVIDER_SITE_OTHER): Payer: Self-pay | Admitting: Internal Medicine

## 2021-01-31 DIAGNOSIS — J069 Acute upper respiratory infection, unspecified: Secondary | ICD-10-CM

## 2021-01-31 NOTE — Telephone Encounter (Signed)
Sounds like a lot of different symptoms going on. I think either type appointment would be fine, I would favor in person as an exam would be helpful here to fully assess. But understandable if she wants to start with telehealth.

## 2021-01-31 NOTE — Progress Notes (Signed)
   I connected with  Lori Murray on 02/02/21 by telephone and verified that I am speaking with the correct person using two identifiers.   I discussed the limitations of evaluation and management by telemedicine. The patient expressed understanding and agreed to proceed.  CC: Upper respiratory viral infection   This is a telephone encounter between Lori Murray and Lori Murray on 02/02/2021 for URI. The visit was conducted with the patient located at home and Lori Murray at [facility]. The patient's identity was confirmed using their DOB and current address. The patient has consented to being evaluated through a telephone encounter and understands the associated risks (an examination cannot be done and the patient may need to come in for an appointment) / benefits (allows the patient to remain at home, decreasing exposure to coronavirus). I personally spent 12 minutes on medical discussion.   HPI:  [name] is a 50 y.o. with PMH as below.   Please see A&P for assessment of the patient's acute and chronic medical conditions.   Past Medical History:  Diagnosis Date   Anemia    iron deficiency   Depression    Fibroids    s/p TAH- right salpingo- oophorectomy in 2010   Gardnerella vaginalis infection    recurrent   History of tension headache    History of tobacco abuse    Horner's syndrome    dry eye syndrome: OS>OD 2/2 horner's syndrome. followed at [facility] center; LUL ptosis 2/2 Horner's; f/u with Dr. Levan Hurst   Metrorrhagia    h/o treated with OCP.   Schwannoma 12/14/2008   Left sided benign Schwanoma( retropharyngeal space ) s/p resection in 01/11.      Review of Systems:  see problem based assessment and plan.  Assessment & Plan:   See Encounters Tab for problem based charting.  Patient discussed with Dr. Dareen Piano

## 2021-02-02 ENCOUNTER — Encounter: Payer: Self-pay | Admitting: Internal Medicine

## 2021-02-02 DIAGNOSIS — J069 Acute upper respiratory infection, unspecified: Secondary | ICD-10-CM | POA: Insufficient documentation

## 2021-02-02 HISTORY — DX: Acute upper respiratory infection, unspecified: J06.9

## 2021-02-02 NOTE — Assessment & Plan Note (Addendum)
Patient presents with a 2 day history of chills, productive cough with green sputum, rhinorrhea, sore throat, and difficulty sleeping. She states that she has some SHOB but believes it is 2/2 to her nasal symptoms and cough. She has take a COVID test which was negative. She has had 3 covid vaccines.    She states that she ahs been using OTC cold/flu medicine with some improvement of symptoms.    Assessment/plan: Patient likely has an upper respiratory viral infection. I counseled her regarding OTC medications like guaifenesin and dextromethorphan to help with her cough. We discussed using an intranasal steroid and an antihistamine to help with her sinus symptoms.  - Continue dextromethorphan for cough - Continue tylenol for muscle pain - Start intranasal steroid like Flonase  - Start antihistamine like Zyrtec  - Gave strict return precautions regarding SHOB and worsening symptoms.

## 2021-02-04 NOTE — Progress Notes (Signed)
Internal Medicine Clinic Attending  Case discussed with Dr. Coe  At the time of the visit.  We reviewed the resident's history and exam and pertinent patient test results.  I agree with the assessment, diagnosis, and plan of care documented in the resident's note.  

## 2021-02-07 ENCOUNTER — Encounter: Payer: Self-pay | Admitting: Internal Medicine

## 2021-07-30 ENCOUNTER — Ambulatory Visit (INDEPENDENT_AMBULATORY_CARE_PROVIDER_SITE_OTHER): Payer: PRIVATE HEALTH INSURANCE | Admitting: Student

## 2021-07-30 ENCOUNTER — Other Ambulatory Visit: Payer: Self-pay

## 2021-07-30 ENCOUNTER — Encounter: Payer: Self-pay | Admitting: Student

## 2021-07-30 VITALS — BP 130/88 | HR 64 | Temp 98.0°F | Resp 24 | Ht 62.0 in | Wt 170.6 lb

## 2021-07-30 DIAGNOSIS — F339 Major depressive disorder, recurrent, unspecified: Secondary | ICD-10-CM | POA: Diagnosis not present

## 2021-07-30 DIAGNOSIS — K117 Disturbances of salivary secretion: Secondary | ICD-10-CM

## 2021-07-30 DIAGNOSIS — E7800 Pure hypercholesterolemia, unspecified: Secondary | ICD-10-CM | POA: Diagnosis not present

## 2021-07-30 DIAGNOSIS — E785 Hyperlipidemia, unspecified: Secondary | ICD-10-CM | POA: Diagnosis not present

## 2021-07-30 DIAGNOSIS — Z0000 Encounter for general adult medical examination without abnormal findings: Secondary | ICD-10-CM

## 2021-07-30 MED ORDER — PILOCARPINE HCL 5 MG PO TABS: 5.0000 mg | ORAL_TABLET | Freq: Every day | ORAL | 2 refills | Status: DC

## 2021-07-30 MED ORDER — SERTRALINE HCL 50 MG PO TABS: 50.0000 mg | ORAL_TABLET | Freq: Every day | ORAL | 2 refills | Status: DC

## 2021-07-30 NOTE — Assessment & Plan Note (Signed)
Patient with long history of xerostomia. States that she underwent resection of a schwannoma in 2011 and was told by surgeon that she could experience symptoms of dry mouth and dry eyes at the time. She has had mild xerostomia in the past that was well controlled with fluoride toothpaste and artificial saliva. She also has used a humidifier that helps partially relieve symptoms. However, these therapies are no longer providing much benefit to her. She endorses drinking water frequently to keep her mouth moist and has noticed that she has to cough more often due to her throat being very dry. States that her symptoms are predominantly present at nighttime. She wakes up 2-3 times each night and has to drink water to moisten her mouth.   We discussed utility of pilocarpine. Reviewed adverse effects of pilocarpine with her including increased sweating, urinary frequency, flushing, chills, nausea, diarrhea. She states that her dry mouth is really troubling her and so she does want to trial pilocarpine. Since her symptoms are nighttime predominant, will start with pilocarpine 5mg  qhs.   Plan: -start pilocarpine 5mg  qhs

## 2021-07-30 NOTE — Patient Instructions (Signed)
Lori Murray,  It was a pleasure seeing you in the clinic today.   I have prescribed a medicine to help with your dry mouth. It is called Pilocarpine. Please take 1 tablet before bedtime. Please get your mammogram done next month. You can get your shingles vaccine at any pharmacy [facility]).  Please call our clinic at [phone] if you have any questions or concerns. The best time to call is Monday-Friday from 9am-4pm, but there is someone available 24/7 at the same number. If you need medication refills, please notify your pharmacy one week in advance and they will send [place] a request.   Thank you for letting us take part in your care. We look forward to seeing you next time!

## 2021-07-30 NOTE — Assessment & Plan Note (Signed)
Patient reports doing well with zoloft therapy. She has remained adherent with this. Refilled zoloft today.  Plan: -continue zoloft 50mg  daily

## 2021-07-30 NOTE — Progress Notes (Signed)
° °  CC: f/u of dry mouth  HPI:  [name] is a 51 y.o. female with history listed below presenting to the [facility] for f/u of dry mouth. Please see individualized problem based charting for full HPI.  Past Medical History:  Diagnosis Date   Anemia    iron deficiency   Depression    Fibroids    s/p TAH- right salpingo- oophorectomy in 2010   Gardnerella vaginalis infection    recurrent   History of tension headache    History of tobacco abuse    Horner's syndrome    dry eye syndrome: OS>OD 2/2 horner's syndrome. followed at [facility] center; LUL ptosis 2/2 Horner's; f/u with Dr. Levan Hurst   Metrorrhagia    h/o treated with OCP.   Schwannoma 12/14/2008   Left sided benign Schwanoma( retropharyngeal space ) s/p resection in 01/11.       Review of Systems:  Negative aside from that listed in individualized problem based charting.  Physical Exam:  Vitals:   07/30/21 1409  BP: 130/88  Pulse: 64  Resp: (!) 24  Temp: 98 F (36.7 C)  TempSrc: Oral  SpO2: 99%  Weight: 170 lb 9.6 oz (77.4 kg)  Height: 5\' 2"  (1.575 m)   Physical Exam Constitutional:      Appearance: She is obese. She is not ill-appearing.  HENT:     Nose: Nose normal. No congestion.     Mouth/Throat:     Mouth: Mucous membranes are moist.     Pharynx: Oropharynx is clear. No oropharyngeal exudate.  Eyes:     Extraocular Movements: Extraocular movements intact.     Conjunctiva/sclera: Conjunctivae normal.     Pupils: Pupils are equal, round, and reactive to light.  Cardiovascular:     Rate and Rhythm: Normal rate and regular rhythm.     Pulses: Normal pulses.     Heart sounds: Normal heart sounds. No murmur heard.   No gallop.  Pulmonary:     Effort: Pulmonary effort is normal.     Breath sounds: Normal breath sounds. No wheezing, rhonchi or rales.  Abdominal:     General: Bowel sounds are normal. There is no distension.     Palpations: Abdomen is soft.     Tenderness: There is no abdominal  tenderness.  Musculoskeletal:        General: Normal range of motion.  Skin:    General: Skin is warm and dry.  Neurological:     General: No focal deficit present.     Mental Status: She is alert and oriented to person, place, and time.  Psychiatric:        Mood and Affect: Mood normal.        Behavior: Behavior normal.     Assessment & Plan:   See Encounters Tab for problem based charting.  Patient discussed with Dr. Evette Doffing

## 2021-07-30 NOTE — Assessment & Plan Note (Signed)
States that she received her flu shot earlier this year at [facility]. Does not remember date. Will postpone flu shot to next year.  She is due for her mammogram. States that she has it scheduled for March but cannot remember name of imaging center. Unable to find appointment on Epic, but discussed with patient to inform us when she obtains it so that we can request records.  Advised patient to receive shingles vaccine at local pharmacy.

## 2021-07-31 DIAGNOSIS — E785 Hyperlipidemia, unspecified: Secondary | ICD-10-CM | POA: Insufficient documentation

## 2021-07-31 LAB — LIPID PANEL
Chol/HDL Ratio: 5.4 ratio — ABNORMAL HIGH (ref 0.0–4.4)
Cholesterol, Total: 280 mg/dL — ABNORMAL HIGH (ref 100–199)
HDL: 52 mg/dL (ref >39)
LDL Chol Calc (NIH): 195 mg/dL — ABNORMAL HIGH (ref 0–99)
Triglycerides: 174 mg/dL — ABNORMAL HIGH (ref 0–149)
VLDL Cholesterol Cal: 33 mg/dL (ref 5–40)

## 2021-07-31 MED ORDER — ATORVASTATIN CALCIUM 40 MG PO TABS
40.0000 mg | ORAL_TABLET | Freq: Every day | ORAL | 1 refills | Status: DC
Start: 2021-07-31 — End: 2021-10-05

## 2021-07-31 NOTE — Addendum Note (Signed)
Addended by: Virl Axe on: 07/31/2021 02:48 PM   Modules accepted: Orders

## 2021-07-31 NOTE — Assessment & Plan Note (Signed)
Lipid Panel     Component Value Date/Time   CHOL 280 (H) 07/30/2021 1531   TRIG 174 (H) 07/30/2021 1531   HDL 52 07/30/2021 1531   CHOLHDL 5.4 (H) 07/30/2021 1531   CHOLHDL 3.9 Ratio 04/03/2010 2248   VLDL 29 04/03/2010 2248   LDLCALC 195 (H) 07/30/2021 1531   LABVLDL 33 07/30/2021 1531   Lipid panel yesterday showing total cholesterol 280 and LDL cholesterol 195. She does not have any history of heart disease or diabetes but given LDL >190, statin therapy is indicated for primary prevention. Discussed results and plan with patient, she is agreeable with this. Will initiate therapy with lipitor 40mg  daily, can titrate up if tolerated and if repeat lipid panel in 3-6 months fails to show improvement in LDL by 50%.  Plan: -start lipitor 40mg  daily -repeat lipid panel in 3-6 months, increase lipitor if LDL >100

## 2021-07-31 NOTE — Progress Notes (Signed)
Internal Medicine Clinic Attending  Case discussed with Dr. Jinwala  At the time of the visit.  We reviewed the resident's history and exam and pertinent patient test results.  I agree with the assessment, diagnosis, and plan of care documented in the resident's note.  

## 2021-09-25 ENCOUNTER — Other Ambulatory Visit: Payer: Self-pay | Admitting: Student

## 2021-10-01 ENCOUNTER — Other Ambulatory Visit: Payer: Self-pay

## 2021-10-28 ENCOUNTER — Other Ambulatory Visit: Payer: Self-pay | Admitting: Student

## 2021-10-28 NOTE — Telephone Encounter (Signed)
Next appt scheduled 6/19 with Dr Allyson Sabal.

## 2021-11-05 NOTE — Telephone Encounter (Signed)
Medication refilled. Please have pt return to the office for follow up of dry mouth and med recheck.

## 2021-11-25 ENCOUNTER — Ambulatory Visit (INDEPENDENT_AMBULATORY_CARE_PROVIDER_SITE_OTHER): Payer: PRIVATE HEALTH INSURANCE | Admitting: Student

## 2021-11-25 ENCOUNTER — Encounter: Payer: Self-pay | Admitting: Student

## 2021-11-25 VITALS — BP 120/91 | HR 60 | Temp 97.6°F | Ht 62.0 in | Wt 166.3 lb

## 2021-11-25 DIAGNOSIS — M19049 Primary osteoarthritis, unspecified hand: Secondary | ICD-10-CM

## 2021-11-25 DIAGNOSIS — Z1159 Encounter for screening for other viral diseases: Secondary | ICD-10-CM

## 2021-11-25 DIAGNOSIS — E785 Hyperlipidemia, unspecified: Secondary | ICD-10-CM

## 2021-11-25 DIAGNOSIS — Z131 Encounter for screening for diabetes mellitus: Secondary | ICD-10-CM

## 2021-11-25 DIAGNOSIS — R413 Other amnesia: Secondary | ICD-10-CM

## 2021-11-25 DIAGNOSIS — K117 Disturbances of salivary secretion: Secondary | ICD-10-CM

## 2021-11-25 DIAGNOSIS — Z0000 Encounter for general adult medical examination without abnormal findings: Secondary | ICD-10-CM

## 2021-11-25 MED ORDER — PILOCARPINE HCL 5 MG PO TABS: ORAL_TABLET | ORAL | 2 refills | Status: DC

## 2021-11-25 MED ORDER — ATORVASTATIN CALCIUM 40 MG PO TABS: ORAL_TABLET | ORAL | 2 refills | Status: DC

## 2021-11-25 NOTE — Assessment & Plan Note (Addendum)
Patient with hyperlipidemia, started on lipitor at last visit given LDL >190. She reports taking the medicine for about 2-3 months without issues but ran out of it 1-2 months ago and thought that she did not need to take it anymore. Discussed that if she is close to running out of medicine, to contact [facility] for additional refills. She has incorporated lifestyle modifications (healthier diet low in oils/processed foods) and has been trying to exercise every other day as able with her schedule. We will obtain a repeat lipid panel today as patient does not want to be on statin if cholesterol levels are improved. I do believe that she will need long-term statin therapy given prior LDL was very elevated. Last A1c was 5.4% in 06/2020, will check again today for screening given concomitant HLD.  Plan: -f/u lipid panel and A1c -continue statin  ADDENDUM: Lipid panel with LDL 188, discussed with patient to take statin daily for primary prevention. She confirms understanding.

## 2021-11-25 NOTE — Assessment & Plan Note (Signed)
Patient reports continued intermittent symptoms of mild pain and mild stiffness of joints in bilateral hands. She does have Heberden nodules on bilateral hands as previously described. She has not taken tylenol or ibuprofen for relief. Discussed conservative management with warm water soaks, tylenol or ibuprofen prn, and hand exercises. Fortunately, this has not become functionally limiting as she is still able to tend to her garden and complete her chores.   Plan: -tylenol or ibuprofen prn -warm water soaks

## 2021-11-25 NOTE — Patient Instructions (Signed)
Lori Murray,  It was a pleasure seeing you in the clinic today.   We will check your cholesterol and sugar levels today. I will call you with the results. I have refilled your cholesterol medicine. Please use tylenol and ibuprofen as needed for the pain in your hands. We will do a test for your memory at your next visit in 1 month.  Please call our clinic at [phone] if you have any questions or concerns. The best time to call is Monday-Friday from 9am-4pm, but there is someone available 24/7 at the same number. If you need medication refills, please notify your pharmacy one week in advance and they will send [place] a request.   Thank you for letting us take part in your care. We look forward to seeing you next time!

## 2021-11-25 NOTE — Assessment & Plan Note (Signed)
Patient reports that she is having difficulty with her memory. This is primarily apparent for her in the context of academia. She started college about 3 years ago and has noted that it is becoming difficult for her to retain information. She states that when she reads something, it is difficult for her to recall that information at a later time. She does have other stressors at home that interfere with her studying, including a son who is dealing with addiction. She feels like her old age may be limiting her memory retention. No family history of dementia or neurocognitive issues. Discussed obtaining MoCA at next visit in 1 month to ensure no memory deficits.  Plan: -MoCA at next visit in 1 month

## 2021-11-25 NOTE — Assessment & Plan Note (Signed)
Discussed obtaining Shingles vaccine. She states she will go to pharmacy to get her vaccine.  Discussed need for screening colonoscopy. She states that she got a colonoscopy done a few years ago, but no records of this on chart review. States that she will bring in the records if she can find them. She is amenable to referral for screening colonoscopy if she is unable to find records. Please address at next visit.  Getting Hepatitis C screening today.

## 2021-11-25 NOTE — Assessment & Plan Note (Signed)
States that her symptoms have greatly improved after starting pilocarpine. She is no longer having nighttime awakenings from dry mouth and is tolerating medicine without issues.  -continue pilocarpine '5mg'$  qhs

## 2021-11-25 NOTE — Progress Notes (Signed)
   CC: HLD, OA  HPI:  [name] is a 51 y.o. female with history listed below presenting to the [facility] for f/u of HLD and OA. Please see individualized problem based charting for full HPI.  Past Medical History:  Diagnosis Date   Anemia    iron deficiency   Chest pain, midsternal 03/01/2020   Depression    Fibroids    s/p TAH- right salpingo- oophorectomy in 2010   Gardnerella vaginalis infection    recurrent   History of COVID-19 07/25/2019   History of tension headache    History of tobacco abuse    Horner's syndrome    dry eye syndrome: OS>OD 2/2 horner's syndrome. followed at [facility] center; LUL ptosis 2/2 Horner's; f/u with Dr. Levan Hurst   Metrorrhagia    h/o treated with OCP.   Musculoskeletal pain 03/01/2020   Schwannoma 12/14/2008   Left sided benign Schwanoma( retropharyngeal space ) s/p resection in 01/11.      Upper respiratory infection 02/02/2021   Xerostomia 03/07/2010   Seen by Dr. Geraldo Docker at [facility] on 03/21/10- could have mild Sjogren Syndrome with weakly positive anti - SSA antibody and ANA but was not confirmed by lip biopsy.      Review of Systems:  Negative aside from that listed in individualized problem based charting.  Physical Exam:  Vitals:   11/25/21 1007  BP: (!) 120/91  Pulse: 60  Temp: 97.6 F (36.4 C)  TempSrc: Oral  SpO2: 100%  Weight: 166 lb 4.8 oz (75.4 kg)  Height: '5\' 2"'$  (1.575 m)   Physical Exam Constitutional:      Appearance: Normal appearance. She is obese. She is not ill-appearing.  HENT:     Mouth/Throat:     Mouth: Mucous membranes are moist.     Pharynx: Oropharynx is clear.  Eyes:     Extraocular Movements: Extraocular movements intact.     Conjunctiva/sclera: Conjunctivae normal.     Pupils: Pupils are equal, round, and reactive to light.  Cardiovascular:     Rate and Rhythm: Normal rate and regular rhythm.     Pulses: Normal pulses.     Heart sounds: Normal heart sounds. No murmur heard.    No  gallop.  Pulmonary:     Effort: Pulmonary effort is normal.     Breath sounds: Normal breath sounds. No wheezing, rhonchi or rales.  Abdominal:     General: Bowel sounds are normal. There is no distension.     Palpations: Abdomen is soft. There is no mass.     Tenderness: There is no abdominal tenderness. There is no guarding or rebound.     Hernia: No hernia is present.  Musculoskeletal:        General: No swelling or tenderness. Normal range of motion.  Skin:    General: Skin is warm and dry.     Comments: Heberden nodules in bilateral hands  Neurological:     General: No focal deficit present.     Mental Status: She is alert and oriented to person, place, and time.  Psychiatric:        Mood and Affect: Mood normal.        Behavior: Behavior normal.      Assessment & Plan:   See Encounters Tab for problem based charting.  Patient discussed with Dr. Heber Humboldt

## 2021-11-26 LAB — HCV INTERPRETATION

## 2021-11-26 LAB — LIPID PANEL
Chol/HDL Ratio: 5.4 ratio — ABNORMAL HIGH (ref 0.0–4.4)
Cholesterol, Total: 265 mg/dL — ABNORMAL HIGH (ref 100–199)
HDL: 49 mg/dL (ref >39)
LDL Chol Calc (NIH): 188 mg/dL — ABNORMAL HIGH (ref 0–99)
Triglycerides: 154 mg/dL — ABNORMAL HIGH (ref 0–149)
VLDL Cholesterol Cal: 28 mg/dL (ref 5–40)

## 2021-11-26 LAB — HEMOGLOBIN A1C
Est. average glucose Bld gHb Est-mCnc: 117 mg/dL
Hgb A1c MFr Bld: 5.7 % — ABNORMAL HIGH (ref 4.8–5.6)

## 2021-11-26 LAB — HCV AB W REFLEX TO QUANT PCR: HCV Ab: NONREACTIVE

## 2022-03-01 ENCOUNTER — Other Ambulatory Visit: Payer: Self-pay | Admitting: Student

## 2022-03-01 DIAGNOSIS — K117 Disturbances of salivary secretion: Secondary | ICD-10-CM

## 2022-03-07 ENCOUNTER — Other Ambulatory Visit: Payer: Self-pay

## 2022-03-07 DIAGNOSIS — K117 Disturbances of salivary secretion: Secondary | ICD-10-CM

## 2022-03-07 DIAGNOSIS — E785 Hyperlipidemia, unspecified: Secondary | ICD-10-CM

## 2022-03-07 DIAGNOSIS — F339 Major depressive disorder, recurrent, unspecified: Secondary | ICD-10-CM

## 2022-03-10 MED ORDER — ATORVASTATIN CALCIUM 40 MG PO TABS: ORAL_TABLET | ORAL | 2 refills | Status: DC

## 2022-03-10 MED ORDER — SERTRALINE HCL 50 MG PO TABS: 50.0000 mg | ORAL_TABLET | Freq: Every day | ORAL | 2 refills | Status: DC

## 2022-03-17 ENCOUNTER — Other Ambulatory Visit: Payer: Self-pay

## 2022-03-17 DIAGNOSIS — E785 Hyperlipidemia, unspecified: Secondary | ICD-10-CM

## 2022-03-17 DIAGNOSIS — K117 Disturbances of salivary secretion: Secondary | ICD-10-CM

## 2022-03-17 DIAGNOSIS — F339 Major depressive disorder, recurrent, unspecified: Secondary | ICD-10-CM

## 2022-03-17 MED ORDER — SERTRALINE HCL 50 MG PO TABS: 50.0000 mg | ORAL_TABLET | Freq: Every day | ORAL | 2 refills | Status: DC

## 2022-03-17 MED ORDER — PILOCARPINE HCL 5 MG PO TABS: ORAL_TABLET | ORAL | 2 refills | Status: DC

## 2022-03-17 MED ORDER — ATORVASTATIN CALCIUM 40 MG PO TABS: ORAL_TABLET | ORAL | 2 refills | Status: DC

## 2022-03-17 NOTE — Telephone Encounter (Signed)
Please send rx to alternative pharmacy, correct pharmacy attached to message.

## 2022-10-08 ENCOUNTER — Other Ambulatory Visit: Payer: Self-pay

## 2022-10-08 ENCOUNTER — Ambulatory Visit: Payer: Self-pay | Admitting: Internal Medicine

## 2022-10-08 ENCOUNTER — Other Ambulatory Visit (HOSPITAL_COMMUNITY): Payer: Self-pay

## 2022-10-08 VITALS — BP 132/84 | HR 59 | Temp 98.4°F | Wt 171.1 lb

## 2022-10-08 DIAGNOSIS — K117 Disturbances of salivary secretion: Secondary | ICD-10-CM

## 2022-10-08 DIAGNOSIS — E785 Hyperlipidemia, unspecified: Secondary | ICD-10-CM

## 2022-10-08 DIAGNOSIS — F339 Major depressive disorder, recurrent, unspecified: Secondary | ICD-10-CM

## 2022-10-08 MED ORDER — PILOCARPINE HCL 7.5 MG PO TABS
7.5000 mg | ORAL_TABLET | Freq: Every day | ORAL | 2 refills | Status: DC
  Filled 2022-10-08: qty 30, 30d supply, fill #0
  Filled 2022-11-17: qty 30, 30d supply, fill #1
  Filled 2022-12-28: qty 30, 30d supply, fill #2

## 2022-10-08 MED ORDER — ATORVASTATIN CALCIUM 40 MG PO TABS
ORAL_TABLET | ORAL | 2 refills | Status: DC
  Filled 2022-10-08: qty 30, 30d supply, fill #0
  Filled 2022-11-17: qty 30, 30d supply, fill #1
  Filled 2022-12-28: qty 30, 30d supply, fill #2
  Filled 2023-02-10: qty 30, 30d supply, fill #3
  Filled 2023-03-19: qty 30, 30d supply, fill #0
  Filled 2023-03-19: qty 30, 30d supply, fill #4
  Filled 2023-04-27: qty 30, 30d supply, fill #1

## 2022-10-08 MED ORDER — SERTRALINE HCL 50 MG PO TABS
50.0000 mg | ORAL_TABLET | Freq: Every day | ORAL | 2 refills | Status: DC
  Filled 2022-10-08: qty 30, 30d supply, fill #0
  Filled 2022-11-17: qty 30, 30d supply, fill #1
  Filled 2022-12-28: qty 30, 30d supply, fill #2
  Filled 2023-02-10: qty 30, 30d supply, fill #3
  Filled 2023-03-19: qty 30, 30d supply, fill #0
  Filled 2023-03-19: qty 30, 30d supply, fill #4
  Filled 2023-04-27: qty 30, 30d supply, fill #1
  Filled 2023-06-21: qty 30, 30d supply, fill #2
  Filled 2023-08-05: qty 30, 30d supply, fill #3
  Filled 2023-09-09 – 2023-09-10 (×2): qty 30, 30d supply, fill #4

## 2022-10-08 NOTE — Patient Instructions (Signed)
Dear Mrs. Lori Murray,  Thank you for trusting [place] with your care.   We discussed your health maintenance items.   We will check your kidneys and electrolytes with a metabolic panel.  Your A1c and cholesterol are doing alright. We don't need to make any big adjustments there.   We will increase your pilocarpine to 7.5 mg nightly.   I have sent your medicines through the [facility] outpatient pharmacy.

## 2022-10-08 NOTE — Progress Notes (Signed)
  N/A - SEE ATTENDING NOTE

## 2022-10-09 ENCOUNTER — Other Ambulatory Visit (HOSPITAL_COMMUNITY): Payer: Self-pay

## 2022-10-09 ENCOUNTER — Other Ambulatory Visit: Payer: Self-pay

## 2022-10-10 ENCOUNTER — Other Ambulatory Visit (HOSPITAL_COMMUNITY): Payer: Self-pay

## 2022-10-10 LAB — BMP8+ANION GAP
Anion Gap: 16 mmol/L (ref 10.0–18.0)
BUN/Creatinine Ratio: 21 (ref 9–23)
BUN: 15 mg/dL (ref 6–24)
CO2: 21 mmol/L (ref 20–29)
Calcium: 9.5 mg/dL (ref 8.7–10.2)
Chloride: 102 mmol/L (ref 96–106)
Creatinine, Ser: 0.7 mg/dL (ref 0.57–1.00)
Glucose: 92 mg/dL (ref 70–99)
Potassium: 4 mmol/L (ref 3.5–5.2)
Sodium: 139 mmol/L (ref 134–144)
eGFR: 103 mL/min/{1.73_m2} (ref >59)

## 2022-10-16 NOTE — Progress Notes (Signed)
51yo Spanish-speaking woman with HLD & depression here for follow up of her chronic medical conditions. She is self pay, which limits her care.  BP 132/84.  BMP within normal limits.  Continue atorvastatin, pilocarpine, and sertraline, which were renewed today.    Patient was seen by Dr. Burnice Logan and I discussed the care with him. He is unable to document the note.  This is a no charge encounter.

## 2022-11-12 ENCOUNTER — Encounter: Payer: Self-pay | Admitting: *Deleted

## 2022-11-17 ENCOUNTER — Other Ambulatory Visit: Payer: Self-pay

## 2022-11-17 ENCOUNTER — Other Ambulatory Visit (HOSPITAL_COMMUNITY): Payer: Self-pay

## 2022-12-28 ENCOUNTER — Other Ambulatory Visit: Payer: Self-pay

## 2022-12-29 ENCOUNTER — Other Ambulatory Visit (HOSPITAL_COMMUNITY): Payer: Self-pay

## 2022-12-29 ENCOUNTER — Other Ambulatory Visit: Payer: Self-pay

## 2023-02-10 ENCOUNTER — Other Ambulatory Visit (HOSPITAL_COMMUNITY): Payer: Self-pay

## 2023-02-10 ENCOUNTER — Other Ambulatory Visit: Payer: Self-pay

## 2023-02-11 ENCOUNTER — Other Ambulatory Visit (HOSPITAL_COMMUNITY): Payer: Self-pay

## 2023-02-11 ENCOUNTER — Ambulatory Visit (INDEPENDENT_AMBULATORY_CARE_PROVIDER_SITE_OTHER): Payer: Self-pay | Admitting: Internal Medicine

## 2023-02-11 ENCOUNTER — Encounter: Payer: Self-pay | Admitting: Internal Medicine

## 2023-02-11 VITALS — BP 136/96 | HR 53 | Temp 97.7°F | Ht 62.0 in | Wt 166.8 lb

## 2023-02-11 DIAGNOSIS — Z23 Encounter for immunization: Secondary | ICD-10-CM

## 2023-02-11 DIAGNOSIS — Z0000 Encounter for general adult medical examination without abnormal findings: Secondary | ICD-10-CM

## 2023-02-11 DIAGNOSIS — K117 Disturbances of salivary secretion: Secondary | ICD-10-CM

## 2023-02-11 DIAGNOSIS — R7303 Prediabetes: Secondary | ICD-10-CM

## 2023-02-11 DIAGNOSIS — K529 Noninfective gastroenteritis and colitis, unspecified: Secondary | ICD-10-CM

## 2023-02-11 DIAGNOSIS — R197 Diarrhea, unspecified: Secondary | ICD-10-CM

## 2023-02-11 DIAGNOSIS — E785 Hyperlipidemia, unspecified: Secondary | ICD-10-CM

## 2023-02-11 NOTE — Patient Instructions (Addendum)
[name], it was a pleasure seeing you today! You endorsed feeling well today. Below are some of the things we talked about this visit. We look forward to seeing you in the follow up appointment!  Today we discussed: For your stomach problems, we will get lab work including blood work and stool testing. Continue to stay hydrated and continue eating. Eat foods that are not spicy and not sugary. I have attached a hand out to this with foods that are good for you.   I have ordered the following labs today:  Lab Orders         Culture, Stool         CBC with Diff         BMP8+Anion Gap         GI Profile, Stool, PCR- [facility]         Lipid Profile         Hemoglobin A1c       Referrals ordered today:   Referral Orders  No referral(s) requested today     I have ordered the following medication/changed the following medications:   Stop the following medications: There are no discontinued medications.   Start the following medications: No orders of the defined types were placed in this encounter.    Follow-up: 6 month or earlier if needed   Please make sure to arrive 15 minutes prior to your next appointment. If you arrive late, you may be asked to reschedule.   We look forward to seeing you next time. Please call our clinic at [phone] if you have any questions or concerns. The best time to call is Monday-Friday from 9am-4pm, but there is someone available 24/7. If after hours or the weekend, call the main hospital number and ask for the Internal Medicine Resident On-Call. If you need medication refills, please notify your pharmacy one week in advance and they will send [place] a request.  Thank you for letting us take part in your care. Wishing you the best!  Thank you, Gwenevere Abbot, MD

## 2023-02-11 NOTE — Progress Notes (Unsigned)
   CC: abdominal pain  HPI:  [name] is a 52 y.o. with medical history of HLD, xerostomia, and MDD presenting to [facility] for complaint of abdominal pain.   Please see problem-based list for further details, assessments, and plans.  Past Medical History:  Diagnosis Date   Anemia    iron deficiency   Chest pain, midsternal 03/01/2020   Depression    Dizziness 07/06/2020   Fibroids    s/p TAH- right salpingo- oophorectomy in 2010   Gardnerella vaginalis infection    recurrent   History of COVID-19 07/25/2019   History of left-sided benign schwannoma s/p resection 2011  12/14/2008   Left sided benign Schwanoma( retropharyngeal space ) s/p resection in 01/11.         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 followed at [facility] center; LUL ptosis 2/2 Horner's; f/u with Dr. Barkley Bruns 07/06/2020   Metrorrhagia    h/o treated with OCP.   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      Upper respiratory infection 02/02/2021   Urinary incontinence 06/12/2017   Xerostomia 03/07/2010   Seen by Dr. Christen Bame at [facility] on 03/21/10- could have mild Sjogren Syndrome with weakly positive anti - SSA antibody and ANA but was not confirmed by lip biopsy.       Current Outpatient Medications (Cardiovascular):    atorvastatin (LIPITOR) 40 MG tablet, TAKE 1 TABLET(40 MG) BY MOUTH DAILY     Current Outpatient Medications (Other):    pilocarpine (SALAGEN) 7.5 MG tablet, Take 1 tablet (7.5 mg total) by mouth at bedtime.   sertraline (ZOLOFT) 50 MG tablet, Take 1 tablet (50 mg total) by mouth daily.  Review of Systems:  Review of system negative unless stated in the problem list or HPI.    Physical Exam:  Vitals:    02/11/23 0836  BP: (!) 136/96  Pulse: (!) 53  Temp: 97.7 F (36.5 C)  TempSrc: Oral  SpO2: 99%  Weight: 166 lb 12.8 oz (75.7 kg)  Height: 5\' 2"  (1.575 m)   Physical Exam General: NAD HENT: NCAT Lungs: CTAB, no wheeze, rhonchi or rales.  Cardiovascular: Normal heart sounds, no r/m/g, 2+ pulses in all extremities. No LE edema Abdomen: No TTP, normal bowel sounds MSK: No asymmetry or muscle atrophy.  Skin: no lesions noted on exposed skin Neuro: Alert and oriented x4. CN grossly intact Psych: Normal mood and normal affect   Assessment & Plan:   No problem-specific Assessment & Plan notes found for this encounter.   See Encounters Tab for problem based charting.  Patient Discussed with Dr. {NAMES:[id number]::"Guilloud","Hoffman","Mullen","Narendra","Vincent","Machen","Lau","Hatcher","Williams"} Gwenevere Abbot, MD Eligha Bridegroom. [facility] Internal Medicine Residency, PGY-3   Started around 08/20. Went to [place] on 08/13. Diarrhea, abdominal pain, vomiting. Had blood in stool and some low grade fevers. Symptoms improved but have recurred. Eating and drinking well.   Will get CBC, BMP,, Stool studies including GI panel, c. diff  A1c, Lipid

## 2023-02-12 DIAGNOSIS — K529 Noninfective gastroenteritis and colitis, unspecified: Secondary | ICD-10-CM | POA: Insufficient documentation

## 2023-02-12 LAB — BMP8+ANION GAP
Anion Gap: 15 mmol/L (ref 10.0–18.0)
BUN/Creatinine Ratio: 14 (ref 9–23)
BUN: 11 mg/dL (ref 6–24)
CO2: 20 mmol/L (ref 20–29)
Calcium: 9.3 mg/dL (ref 8.7–10.2)
Chloride: 106 mmol/L (ref 96–106)
Creatinine, Ser: 0.77 mg/dL (ref 0.57–1.00)
Glucose: 90 mg/dL (ref 70–99)
Potassium: 4.4 mmol/L (ref 3.5–5.2)
Sodium: 141 mmol/L (ref 134–144)
eGFR: 92 mL/min/{1.73_m2} (ref >59)

## 2023-02-12 LAB — CBC WITH DIFFERENTIAL/PLATELET
Basophils Absolute: 0.1 10*3/uL (ref 0.0–0.2)
Basos: 1 %
EOS (ABSOLUTE): 0.2 10*3/uL (ref 0.0–0.4)
Eos: 3 %
Hematocrit: 39.3 % (ref 34.0–46.6)
Hemoglobin: 12.7 g/dL (ref 11.1–15.9)
Immature Grans (Abs): 0 10*3/uL (ref 0.0–0.1)
Immature Granulocytes: 0 %
Lymphocytes Absolute: 2.4 10*3/uL (ref 0.7–3.1)
Lymphs: 32 %
MCH: 29.2 pg (ref 26.6–33.0)
MCHC: 32.3 g/dL (ref 31.5–35.7)
MCV: 90 fL (ref 79–97)
Monocytes Absolute: 0.5 10*3/uL (ref 0.1–0.9)
Monocytes: 6 %
Neutrophils Absolute: 4.5 10*3/uL (ref 1.4–7.0)
Neutrophils: 58 %
Platelets: 308 10*3/uL (ref 150–450)
RBC: 4.35 x10E6/uL (ref 3.77–5.28)
RDW: 13.5 % (ref 11.7–15.4)
WBC: 7.7 10*3/uL (ref 3.4–10.8)

## 2023-02-12 LAB — LIPID PANEL
Chol/HDL Ratio: 4.5 ratio — ABNORMAL HIGH (ref 0.0–4.4)
Cholesterol, Total: 200 mg/dL — ABNORMAL HIGH (ref 100–199)
HDL: 44 mg/dL (ref >39)
LDL Chol Calc (NIH): 124 mg/dL — ABNORMAL HIGH (ref 0–99)
Triglycerides: 182 mg/dL — ABNORMAL HIGH (ref 0–149)
VLDL Cholesterol Cal: 32 mg/dL (ref 5–40)

## 2023-02-12 LAB — HEMOGLOBIN A1C
Est. average glucose Bld gHb Est-mCnc: 117 mg/dL
Hgb A1c MFr Bld: 5.7 % — ABNORMAL HIGH (ref 4.8–5.6)

## 2023-02-12 NOTE — Assessment & Plan Note (Signed)
Pt with HLD with initial LDL >190. She was started on lipitor 40 mg. Her repeat LDL is 124. Goal is <100. I believe she will benefit from addition of zetia given the significantly elevated LDL placing her at higher risk. She reports adherence to lipitor except during her recent travels.

## 2023-02-12 NOTE — Addendum Note (Signed)
Addended by: Dickie La on: 02/12/2023 01:45 PM   Modules accepted: Level of Service

## 2023-02-12 NOTE — Assessment & Plan Note (Signed)
Pt with symptoms of acute gastroenteritis. She has good oral intake. She is still having 6 watery bowel movements per day. Lab work including CBC and BMP unremarkable which is reassuring. GI was initially ordered but was changed to stool culture due to cost. This was discussed with the patient. Will follow up stool culture results and recommended supportive care at that time. Pt had blood in the stool but that has resolved. Suspect that was secondary to GI irritation. She is afebrile. Will follow up on her symptoms once I get the results for her stool culture. Suspect her symptoms will resolve in the coming days.

## 2023-02-12 NOTE — Assessment & Plan Note (Addendum)
A1c checked this visit shows stable at 5.7. Repeat A1c in one year as pt is prediabetic. Encouraged life style modifications.  Flu shot given this visit.

## 2023-02-12 NOTE — Progress Notes (Signed)
Internal Medicine Clinic Attending  Case discussed with the resident at the time of the visit.  We reviewed the resident's history and exam and pertinent patient test results.  I agree with the assessment, diagnosis, and plan of care documented in the resident's note.  Patient with persistent diarrhea following recent travel. Additional symptoms included elevated temperature, abdominal pain, and small amounts of blood in the stool starting about a week after first symptoms and since resolved. We attempted to get a GI pathogen panel today, although given significant cost and uninsured status Dr. Welton Flakes discussed the alternative of stool culture with the patient which was collected at today's visit. The patient is HDS, afebrile, without severe abdominal pain, and able to maintain fluids and nutrition. Fu pending culture results.

## 2023-02-15 LAB — STOOL CULTURE: E coli, Shiga toxin Assay: NEGATIVE

## 2023-02-16 MED ORDER — EZETIMIBE 10 MG PO TABS
10.0000 mg | ORAL_TABLET | Freq: Every day | ORAL | 11 refills | Status: DC
Start: 2023-02-16 — End: 2023-02-17

## 2023-02-16 MED ORDER — PILOCARPINE HCL 7.5 MG PO TABS
7.5000 mg | ORAL_TABLET | Freq: Every day | ORAL | 2 refills | Status: DC
Start: 2023-02-16 — End: 2023-10-01
  Filled 2023-02-19 (×2): qty 30, 30d supply, fill #0
  Filled 2023-03-19 (×2): qty 30, 30d supply, fill #1
  Filled 2023-09-10: qty 30, 30d supply, fill #2

## 2023-02-16 NOTE — Addendum Note (Signed)
Addended by: Gwenevere Abbot on: 02/16/2023 01:11 PM   Modules accepted: Orders

## 2023-02-17 ENCOUNTER — Other Ambulatory Visit (HOSPITAL_COMMUNITY): Payer: Self-pay

## 2023-02-17 ENCOUNTER — Other Ambulatory Visit: Payer: Self-pay | Admitting: Student

## 2023-02-17 DIAGNOSIS — E785 Hyperlipidemia, unspecified: Secondary | ICD-10-CM

## 2023-02-17 MED ORDER — EZETIMIBE 10 MG PO TABS
10.0000 mg | ORAL_TABLET | Freq: Every day | ORAL | 3 refills | Status: DC
Start: 2023-02-17 — End: 2023-10-01
  Filled 2023-02-17 – 2023-03-19 (×2): qty 30, 30d supply, fill #0
  Filled 2023-03-19 – 2023-04-27 (×2): qty 30, 30d supply, fill #1
  Filled 2023-06-21: qty 30, 30d supply, fill #2
  Filled 2023-08-05: qty 30, 30d supply, fill #3
  Filled 2023-09-09 – 2023-09-10 (×2): qty 30, 30d supply, fill #4

## 2023-02-17 NOTE — Telephone Encounter (Signed)
The following Medication needs to go to the [facility] instead  ezetimibe (ZETIA) 10 MG tablet

## 2023-02-19 ENCOUNTER — Other Ambulatory Visit: Payer: Self-pay

## 2023-02-19 ENCOUNTER — Other Ambulatory Visit (HOSPITAL_COMMUNITY): Payer: Self-pay

## 2023-02-20 ENCOUNTER — Other Ambulatory Visit: Payer: Self-pay

## 2023-03-19 ENCOUNTER — Other Ambulatory Visit: Payer: Self-pay

## 2023-04-27 ENCOUNTER — Other Ambulatory Visit: Payer: Self-pay

## 2023-04-27 ENCOUNTER — Other Ambulatory Visit (HOSPITAL_COMMUNITY): Payer: Self-pay

## 2023-06-21 ENCOUNTER — Other Ambulatory Visit: Payer: Self-pay

## 2023-06-22 ENCOUNTER — Other Ambulatory Visit: Payer: Self-pay

## 2023-06-22 ENCOUNTER — Other Ambulatory Visit (HOSPITAL_COMMUNITY): Payer: Self-pay

## 2023-07-30 ENCOUNTER — Other Ambulatory Visit: Payer: Self-pay | Admitting: *Deleted

## 2023-07-30 DIAGNOSIS — E785 Hyperlipidemia, unspecified: Secondary | ICD-10-CM

## 2023-07-30 MED ORDER — ATORVASTATIN CALCIUM 40 MG PO TABS: ORAL_TABLET | ORAL | 0 refills | Status: DC

## 2023-08-03 ENCOUNTER — Other Ambulatory Visit: Payer: Self-pay

## 2023-08-03 ENCOUNTER — Other Ambulatory Visit (HOSPITAL_COMMUNITY): Payer: Self-pay

## 2023-08-03 DIAGNOSIS — E785 Hyperlipidemia, unspecified: Secondary | ICD-10-CM

## 2023-08-03 MED ORDER — ATORVASTATIN CALCIUM 40 MG PO TABS
40.0000 mg | ORAL_TABLET | Freq: Every day | ORAL | 0 refills | Status: DC
  Filled 2023-08-03: qty 30, 30d supply, fill #0
  Filled 2023-09-09 – 2023-09-15 (×2): qty 30, 30d supply, fill #1

## 2023-08-05 ENCOUNTER — Other Ambulatory Visit (HOSPITAL_BASED_OUTPATIENT_CLINIC_OR_DEPARTMENT_OTHER): Payer: Self-pay

## 2023-08-07 ENCOUNTER — Other Ambulatory Visit (HOSPITAL_COMMUNITY): Payer: Self-pay

## 2023-09-10 ENCOUNTER — Other Ambulatory Visit: Payer: Self-pay

## 2023-09-16 ENCOUNTER — Other Ambulatory Visit (HOSPITAL_COMMUNITY): Payer: Self-pay

## 2023-09-30 ENCOUNTER — Other Ambulatory Visit (HOSPITAL_COMMUNITY)
Admission: RE | Admit: 2023-09-30 | Discharge: 2023-09-30 | Disposition: A | Discharge status: Home / Self Care | Payer: Self-pay | Source: Ambulatory Visit | Attending: Internal Medicine | Admitting: Internal Medicine

## 2023-09-30 ENCOUNTER — Ambulatory Visit: Payer: Self-pay | Admitting: Student

## 2023-09-30 VITALS — BP 118/91 | HR 63 | Temp 97.8°F | Ht 62.0 in | Wt 172.9 lb

## 2023-09-30 DIAGNOSIS — N898 Other specified noninflammatory disorders of vagina: Secondary | ICD-10-CM | POA: Diagnosis present

## 2023-09-30 DIAGNOSIS — E785 Hyperlipidemia, unspecified: Secondary | ICD-10-CM

## 2023-09-30 DIAGNOSIS — M7631 Iliotibial band syndrome, right leg: Secondary | ICD-10-CM | POA: Insufficient documentation

## 2023-09-30 DIAGNOSIS — R7303 Prediabetes: Secondary | ICD-10-CM

## 2023-09-30 NOTE — Progress Notes (Signed)
 Established Patient Office Visit  Subjective   Patient ID: Lori Murray, female    DOB: 09/14/1970  Age: 53 y.o. MRN: [id number]  Chief Complaint  Patient presents with   Leg Pain    Leg pain mainly at night / medication refill / vaginal discharge x 3 weeks    Lori Murray is a 53 y.o. who presents to the clinic for R leg pain, vaginal discharge, and HLD . Please see problem based assessment and plan for additional details.   Patient Active Problem List   Diagnosis Date Noted   Prediabetes 09/30/2023   It band syndrome, right 09/30/2023   Vaginal discharge 09/30/2023   Acute gastroenteritis 02/12/2023   Memory changes 11/25/2021   Hyperlipidemia 07/31/2021   Osteoarthritis 07/06/2020   Seasonal allergies 10/07/2018   Preventative health care 06/21/2012   Xerostomia 03/07/2010   Depression 03/17/2006       Objective:     BP (!) 118/91 (BP Location: Left Arm, Patient Position: Sitting, Cuff Size: Normal)   Pulse 63   Temp 97.8 F (36.6 C) (Oral)   Ht 5\' 2"  (1.575 m)   Wt 172 lb 14.4 oz (78.4 kg)   LMP 07/28/2008   SpO2 96%   BMI 31.62 kg/m  BP Readings from Last 3 Encounters:  09/30/23 (!) 118/91  02/11/23 (!) 136/96  10/08/22 132/84   Wt Readings from Last 3 Encounters:  09/30/23 172 lb 14.4 oz (78.4 kg)  02/11/23 166 lb 12.8 oz (75.7 kg)  10/08/22 171 lb 1.6 oz (77.6 kg)      Physical Exam Vitals reviewed.  Constitutional:      General: She is not in acute distress.    Appearance: She is obese. She is not ill-appearing, toxic-appearing or diaphoretic.  HENT:     Right Ear: Tympanic membrane and ear canal normal. There is no impacted cerumen.     Left Ear: Tympanic membrane and ear canal normal. There is no impacted cerumen.  Cardiovascular:     Rate and Rhythm: Normal rate and regular rhythm.     Heart sounds: Normal heart sounds. No murmur heard. Pulmonary:     Effort: Pulmonary effort is normal. No respiratory distress.      Breath sounds: Normal breath sounds. No wheezing or rales.  Musculoskeletal:     Right knee: Crepitus present. No swelling, deformity, ecchymosis or bony tenderness. No tenderness. No LCL laxity, MCL laxity, ACL laxity or PCL laxity.     Instability Tests: Anterior drawer test negative. Posterior drawer test negative. Medial McMurray test negative and lateral McMurray test negative.     Right lower leg: No edema.     Left lower leg: No edema.     Comments: R greater trochanter tenderness with tenderness to palpation along the IT band to her R later knee  R knee has crepitus present   Skin:    General: Skin is warm and dry.  Neurological:     Mental Status: She is alert.  Psychiatric:        Attention and Perception: Attention normal.        Mood and Affect: Mood and affect normal.        Speech: Speech normal.        Behavior: Behavior normal. Behavior is cooperative.      No results found for any visits on 09/30/23.  Last metabolic panel Lab Results  Component Value Date   GLUCOSE 90 02/11/2023   NA 141 02/11/2023   K 4.4  02/11/2023   CL 106 02/11/2023   CO2 20 02/11/2023   BUN 11 02/11/2023   CREATININE 0.77 02/11/2023   EGFR 92 02/11/2023   CALCIUM  9.3 02/11/2023   PROT 7.6 10/24/2016   ALBUMIN 3.8 10/24/2016   BILITOT 0.6 10/24/2016   ALKPHOS 100 10/24/2016   AST 23 10/24/2016   ALT 16 10/24/2016   ANIONGAP 8 10/24/2016   Last lipids Lab Results  Component Value Date   CHOL 200 (H) 02/11/2023   HDL 44 02/11/2023   LDLCALC 124 (H) 02/11/2023   TRIG 182 (H) 02/11/2023   CHOLHDL 4.5 (H) 02/11/2023   Last hemoglobin A1c Lab Results  Component Value Date   HGBA1C 5.7 (H) 02/11/2023      The 10-year ASCVD risk score (Arnett DK, et al., 2019) is: 1.9%    Assessment & Plan:   Problem List Items Addressed This Visit       Musculoskeletal and Integument   It band syndrome, right   Patient reports R greater trochanter pain while she's laying on her R  side at night for the past 3-4 weeks. She denies pain with movement or sitting. This pain is present every night. The lateral hip pain radiates to her right buttocks and to the R knee. She denies recent change in activity or trauma or stiffness in the morning. She has tried tylenol  for pain relief, but this has not helped her.   Exam revealed knee crepitus, tenderness at the R greater trochanter, tenderness to palpitation of the R IT band  Plan: -Conservative management of IT band syndrome: NSAIDs, ice/heat, rest, stretches provided to the patient  -Patient reminded to avoid sleeping on her R side at night  -Patient would be a great candidate for PT, however she does not have insurance -2 month follow up        Other   Hyperlipidemia   Prior LDL in September 2024 was 981 on Lipitor 40mg  -Added Zetia  10mg  in September -Repeat LDL in September 2025, patient does not have insurance and prefers to wait        Prediabetes   A1c was 5.7 in September 2024 -Repeat A1c in September 2025      Vaginal discharge - Primary   Patient reports a change in vaginal discharge 2 weeks ago described as brown and "mucous like" with an odor. She is currently sexually active. She had a total hysterectomy with normal cervical pathology. She denies pain with sexual intercourse.  Plan: -Self swab for vaginitis etiologies       Relevant Orders   Cervicovaginal ancillary only    Return in about 2 months (around 11/30/2023) for Hip pain .    Aurora Lees, DO

## 2023-09-30 NOTE — Patient Instructions (Signed)
 Thank you, Ms.Jillianna Serrano-Esparza for allowing us  to provide your care today. Today we discussed hip and knee pain and vaginal discharge.    We have collected a swab that will tell us  if there are concerns for an infection of your vagina.    Please use ibuprofen , tylenol , and ice/heat on the portion of your hip that hurts at night.  Consider filling out the financial assistance!     I have ordered the following medication/changed the following medications:   Stop the following medications: There are no discontinued medications.   Start the following medications: No orders of the defined types were placed in this encounter.    Follow up: 2 months for lateral hip pain (IT band)     Should you have any questions or concerns please call the internal medicine clinic at [phone].     Please note that our late policy has changed.  If you are more than 15 minutes late to your appointment, you may be asked to reschedule your appointment.  Dr. Sharlon Deacon, D.O. [facility]

## 2023-09-30 NOTE — Assessment & Plan Note (Signed)
 Patient reports R greater trochanter pain while she's laying on her R side at night for the past 3-4 weeks. She denies pain with movement or sitting. This pain is present every night. The lateral hip pain radiates to her right buttocks and to the R knee. She denies recent change in activity or trauma or stiffness in the morning. She has tried tylenol  for pain relief, but this has not helped her.   Exam revealed knee crepitus, tenderness at the R greater trochanter, tenderness to palpitation of the R IT band  Plan: -Conservative management of IT band syndrome: NSAIDs, ice/heat, rest, stretches provided to the patient  -Patient reminded to avoid sleeping on her R side at night  -Patient would be a great candidate for PT, however she does not have insurance -2 month follow up

## 2023-09-30 NOTE — Assessment & Plan Note (Signed)
 Patient reports a change in vaginal discharge 2 weeks ago described as brown and "mucous like" with an odor. She is currently sexually active. She had a total hysterectomy with normal cervical pathology. She denies pain with sexual intercourse.  Plan: -Self swab for vaginitis etiologies

## 2023-09-30 NOTE — Assessment & Plan Note (Signed)
 A1c was 5.7 in September 2024 -Repeat A1c in September 2025

## 2023-09-30 NOTE — Assessment & Plan Note (Signed)
 Prior LDL in September 2024 was 914 on Lipitor 40mg  -Added Zetia  10mg  in September -Repeat LDL in September 2025, patient does not have insurance and prefers to wait

## 2023-10-01 ENCOUNTER — Other Ambulatory Visit (HOSPITAL_COMMUNITY): Payer: Self-pay

## 2023-10-01 ENCOUNTER — Other Ambulatory Visit: Payer: Self-pay

## 2023-10-01 ENCOUNTER — Other Ambulatory Visit: Payer: Self-pay | Admitting: Student

## 2023-10-01 DIAGNOSIS — K117 Disturbances of salivary secretion: Secondary | ICD-10-CM

## 2023-10-01 DIAGNOSIS — F339 Major depressive disorder, recurrent, unspecified: Secondary | ICD-10-CM

## 2023-10-01 DIAGNOSIS — E785 Hyperlipidemia, unspecified: Secondary | ICD-10-CM

## 2023-10-01 LAB — CERVICOVAGINAL ANCILLARY ONLY
Bacterial Vaginitis (gardnerella): POSITIVE — AB
Candida Glabrata: NEGATIVE
Candida Vaginitis: NEGATIVE
Chlamydia: NEGATIVE
Comment: NEGATIVE
Comment: NEGATIVE
Comment: NEGATIVE
Comment: NEGATIVE
Comment: NEGATIVE
Comment: NORMAL
Neisseria Gonorrhea: NEGATIVE
Trichomonas: NEGATIVE

## 2023-10-01 MED ORDER — METRONIDAZOLE 500 MG PO TABS
500.0000 mg | ORAL_TABLET | Freq: Two times a day (BID) | ORAL | 0 refills | Status: DC
  Filled 2023-10-01 (×2): qty 28, 14d supply, fill #0

## 2023-10-01 MED ORDER — CLINDAMYCIN PHOSPHATE 2 % VA GEL
VAGINAL | 0 refills | Status: DC
  Filled 2023-10-01 – 2023-10-02 (×2): qty 8, fill #0

## 2023-10-01 MED ORDER — EZETIMIBE 10 MG PO TABS
10.0000 mg | ORAL_TABLET | Freq: Every day | ORAL | 3 refills | Status: AC
  Filled 2023-10-01: qty 30, 30d supply, fill #0
  Filled 2023-10-22: qty 90, 90d supply, fill #0
  Filled 2023-10-26: qty 30, 30d supply, fill #0
  Filled 2023-10-27: qty 90, 90d supply, fill #0
  Filled 2024-01-20 (×5): qty 90, 90d supply, fill #1
  Filled 2024-04-26: qty 90, 90d supply, fill #2

## 2023-10-01 MED ORDER — SERTRALINE HCL 50 MG PO TABS
50.0000 mg | ORAL_TABLET | Freq: Every day | ORAL | 2 refills | Status: AC
  Filled 2023-10-01 – 2023-10-22 (×2): qty 90, 90d supply, fill #0
  Filled 2023-10-26: qty 30, 30d supply, fill #0
  Filled 2023-10-27: qty 90, 90d supply, fill #0
  Filled 2024-01-20 (×4): qty 90, 90d supply, fill #1
  Filled 2024-04-26: qty 90, 90d supply, fill #2

## 2023-10-01 MED ORDER — ATORVASTATIN CALCIUM 40 MG PO TABS
40.0000 mg | ORAL_TABLET | Freq: Every day | ORAL | 0 refills | Status: DC
Start: 2023-10-01 — End: 2024-01-20
  Filled 2023-10-01: qty 30, 30d supply, fill #0
  Filled 2023-10-22: qty 90, 90d supply, fill #0
  Filled 2023-10-26: qty 30, 30d supply, fill #0
  Filled 2023-10-27: qty 90, 90d supply, fill #0

## 2023-10-01 MED ORDER — PILOCARPINE HCL 7.5 MG PO TABS
7.5000 mg | ORAL_TABLET | Freq: Every day | ORAL | 2 refills | Status: DC
  Filled 2023-10-01 – 2023-10-27 (×4): qty 30, 30d supply, fill #0
  Filled 2023-12-24: qty 30, 30d supply, fill #1
  Filled 2024-01-20 (×4): qty 30, 30d supply, fill #2

## 2023-10-02 ENCOUNTER — Other Ambulatory Visit: Payer: Self-pay | Admitting: Student

## 2023-10-02 ENCOUNTER — Other Ambulatory Visit (HOSPITAL_COMMUNITY): Payer: Self-pay

## 2023-10-02 ENCOUNTER — Other Ambulatory Visit: Payer: Self-pay

## 2023-10-02 MED ORDER — METRONIDAZOLE 500 MG PO TABS
500.0000 mg | ORAL_TABLET | Freq: Two times a day (BID) | ORAL | 0 refills | Status: DC
  Filled 2023-10-02: qty 14, 7d supply, fill #0

## 2023-10-02 NOTE — Progress Notes (Signed)
 Spoke on the phone with the patient after pharmacy staff contacted me with the price of the partners medications (200$) since she is uninsured.   Patient reported that her partner has insurance and a PCP. When discussing options, she prefers for him to be seen by his PCP and have HIS PCP order the treatment for him.    Plan: -Patient advised to avoid sexual intercourse until both her and her partner are treated -Flagyl  500 mg BID for 7 days -Patient's partner will contact his PCP and be evaluated/treated through them

## 2023-10-05 ENCOUNTER — Other Ambulatory Visit (HOSPITAL_COMMUNITY): Payer: Self-pay

## 2023-10-09 NOTE — Progress Notes (Signed)
 Internal Medicine Clinic Attending  Case discussed with the resident at the time of the visit.  We reviewed the resident's history and exam and pertinent patient test results.  I agree with the assessment, diagnosis, and plan of care documented in the resident's note.

## 2023-10-20 ENCOUNTER — Ambulatory Visit: Payer: Self-pay | Admitting: Student

## 2023-10-20 ENCOUNTER — Encounter (HOSPITAL_COMMUNITY): Payer: Self-pay

## 2023-10-20 ENCOUNTER — Other Ambulatory Visit (HOSPITAL_COMMUNITY): Payer: Self-pay

## 2023-10-20 VITALS — BP 124/79 | HR 59 | Temp 98.4°F | Ht 62.0 in | Wt 170.1 lb

## 2023-10-20 DIAGNOSIS — R300 Dysuria: Secondary | ICD-10-CM | POA: Insufficient documentation

## 2023-10-20 DIAGNOSIS — R1011 Right upper quadrant pain: Secondary | ICD-10-CM

## 2023-10-20 DIAGNOSIS — B9689 Other specified bacterial agents as the cause of diseases classified elsewhere: Secondary | ICD-10-CM

## 2023-10-20 DIAGNOSIS — N760 Acute vaginitis: Secondary | ICD-10-CM

## 2023-10-20 DIAGNOSIS — R5383 Other fatigue: Secondary | ICD-10-CM

## 2023-10-20 MED ORDER — CLINDAMYCIN PHOSPHATE 2 % VA CREA
1.0000 | TOPICAL_CREAM | Freq: Every day | VAGINAL | 0 refills | Status: DC
  Filled 2023-10-20 – 2023-10-26 (×4): qty 40, 7d supply, fill #0

## 2023-10-20 NOTE — Assessment & Plan Note (Signed)
 Atypical for cholecystitis.  No nausea, vomiting, or diarrhea.  Negative Murphy sign, rather she has more pain on expiration.  She is well-appearing overall.  Check CMP.

## 2023-10-20 NOTE — Progress Notes (Signed)
 Patient name: Lori Murray Date of birth: 1970-07-13 Date of visit: 10/20/23  Subjective  Chief Complaint  Patient presents with   2wk follow up    Vaginal discharge resolved but still having itching     Fatigue    " I feel very very tired"     HPI Lori Murray is here for 2 week follow-up.  She was treated for bacterial vaginosis due to gardnerella. She is still having symptoms. Discharge improved but she is still itching and has dysuria. She has been abstaining from sex. She took all 7 days of metronidazole  but her infection hasn't improved much despite this.   Review of Systems  Constitutional:  Positive for malaise/fatigue. Negative for weight loss.  Respiratory:  Negative for cough and shortness of breath.   Gastrointestinal:  Positive for abdominal pain (stinging pain under the ribs). Negative for constipation, diarrhea and vomiting.  Genitourinary:  Positive for dysuria. Negative for frequency.  Musculoskeletal:  Negative for falls.  Psychiatric/Behavioral:  Negative for depression. The patient has insomnia.     Patient Active Problem List   Diagnosis Date Noted   Dysuria 10/20/2023   Prediabetes 09/30/2023   It band syndrome, right 09/30/2023   Vaginal discharge 09/30/2023   Acute gastroenteritis 02/12/2023   Memory changes 11/25/2021   Hyperlipidemia 07/31/2021   Osteoarthritis 07/06/2020   Seasonal allergies 10/07/2018   Right upper quadrant abdominal pain 01/05/2014   Preventative health care 06/21/2012   Xerostomia 03/07/2010   Fatigue 10/11/2007   Depression 03/17/2006   Past Medical History:  Diagnosis Date   Anemia    iron  deficiency   Chest pain, midsternal 03/01/2020   Depression    Dizziness 07/06/2020   Fibroids    s/p TAH- right salpingo- oophorectomy in 2010   Gardnerella vaginalis infection    recurrent   History of COVID-19 07/25/2019   History of left-sided benign schwannoma s/p resection 2011  12/14/2008   Left sided  benign Schwanoma( retropharyngeal space ) s/p resection in 01/11.         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 followed at [facility] center; LUL ptosis 2/2 Horner's; f/u with Dr. Deatra Face 07/06/2020   Metrorrhagia    h/o treated with OCP.   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      Upper respiratory infection 02/02/2021   Urinary incontinence 06/12/2017   Xerostomia 03/07/2010   Seen by Dr. Valen at [facility] on 03/21/10- could have mild Sjogren Syndrome with weakly positive anti - SSA antibody and ANA but was not confirmed by lip biopsy.     Outpatient Encounter Medications as of 10/20/2023  Medication Sig   clindamycin  (CLEOCIN ) 2 % vaginal cream Place 1 Applicatorful vaginally at bedtime for 7 days.   atorvastatin  (LIPITOR) 40 MG tablet Take 1 tablet (40 mg total) by mouth daily.   ezetimibe  (ZETIA ) 10 MG tablet Take 1 tablet (10 mg total) by mouth daily.   metroNIDAZOLE  (FLAGYL ) 500 MG tablet Take 1 tablet (500 mg total) by mouth 2 (two) times daily.   pilocarpine  (SALAGEN ) 7.5 MG tablet Take 1 tablet (7.5 mg total) by mouth at bedtime.   sertraline  (ZOLOFT ) 50 MG tablet Take 1 tablet (50 mg total) by  mouth daily.   No facility-administered encounter medications on file as of 10/20/2023.   Past Surgical History:  Procedure Laterality Date   ABDOMINOPLASTY     benign schwannoma   06/20/09   excision from left neck, by Dr. Jhon Moselle [facility])   OOPHORECTOMY     L   TUMOR REMOVAL     Family History  Problem Relation Age of Onset   Diabetes Neg Hx    Hypertension Neg Hx    Depression Neg Hx    Social History   Socioeconomic History   Marital status: Legally Separated    Spouse name: Not on file    Number of children: Not on file   Years of education: Not on file   Highest education level: Not on file  Occupational History   Not on file  Tobacco Use   Smoking status: Former    Current packs/day: 0.00    Types: Cigarettes    Quit date: 07/24/2017    Years since quitting: 6.2   Smokeless tobacco: Never  Substance and Sexual Activity   Alcohol use: No   Drug use: No   Sexual activity: Yes    Birth control/protection: None  Other Topics Concern   Not on file  Social History Narrative   ** Merged History Encounter **       Regular exercise- yes.   Social Drivers of Corporate investment banker Strain: Not on file  Food Insecurity: Not on file  Transportation Needs: Not on file  Physical Activity: Not on file  Stress: Not on file  Social Connections: Unknown (10/22/2021)   Received from [facility]   Social Network    Social Network: Not on file  Intimate Partner Violence: Unknown (09/12/2021)   Received from [facility]   HITS    Physically Hurt: Not on file    Insult or Talk Down To: Not on file    Threaten Physical Harm: Not on file    Scream or Curse: Not on file     Objective  Today's Vitals   10/20/23 0841 10/20/23 0924  BP: (!) 134/98 124/79  Pulse: 63 (!) 59  Temp: 98.4 F (36.9 C)   TempSrc: Oral   SpO2: 99%   Weight: 170 lb 1.6 oz (77.2 kg)   Height: 5\' 2"  (1.575 m)   Body mass index is 31.11 kg/m.   Physical Exam Exam conducted with a chaperone present.  Constitutional:      General: She is not in acute distress.    Appearance: Normal appearance.  HENT:     Mouth/Throat:     Mouth: Mucous membranes are moist.  Eyes:     Extraocular Movements: Extraocular movements intact.     Conjunctiva/sclera: Conjunctivae normal.     Pupils: Pupils are equal, round, and reactive to light.  Neck:     Thyroid: No thyroid mass, thyromegaly or thyroid tenderness.  Cardiovascular:     Rate and Rhythm: Normal rate and  regular rhythm.     Pulses: Normal pulses.     Heart sounds: No murmur heard. Pulmonary:     Effort: Pulmonary effort is normal.     Breath sounds: Normal breath sounds. No wheezing or rales.  Abdominal:     Palpations: Abdomen is soft. There is no hepatomegaly, splenomegaly or mass.     Tenderness: There is abdominal tenderness (RUQ with expiration).  Genitourinary:    General: Normal vulva.     Pubic Area:  No rash.      Labia:        Right: No rash or lesion.        Left: No rash or lesion.   Musculoskeletal:     Right lower leg: No edema.     Left lower leg: No edema.  Lymphadenopathy:     Cervical: No cervical adenopathy.  Skin:    General: Skin is warm and dry.     Coloration: Skin is not jaundiced.  Neurological:     Mental Status: She is alert. Mental status is at baseline.     Cranial Nerves: No facial asymmetry.     Motor: No tremor.     Deep Tendon Reflexes: Reflexes normal.  Psychiatric:        Mood and Affect: Mood normal.        Behavior: Behavior normal.      Assessment & Plan  Problem List Items Addressed This Visit     Fatigue - Primary   She wonders if this could be a side effect of metronidazole .  She sleeps poorly and this is chronic.  Paucity of systemic signs or symptoms, she appears well overall.  Lab workup today will include CBC and CMP.      Relevant Orders   CBC with Differential/Platelet   CMP14 + Anion Gap   Right upper quadrant abdominal pain   Atypical for cholecystitis.  No nausea, vomiting, or diarrhea.  Negative Murphy sign, rather she has more pain on expiration.  She is well-appearing overall.  Check CMP.      Relevant Orders   CMP14 + Anion Gap   Dysuria   This could be UTI or persistent bacterial vaginosis although the latter is less likely as her discharge is improved.  External vaginal exam is normal, no lesions to suggest HSV.  UA today, I will send the intravaginal clindamycin  to pharmacy in case UA is negative in which case  I will treat for persistent BV.      Relevant Orders   Urinalysis, Reflex Microscopic   Other Visit Diagnoses       Bacterial vaginosis       Relevant Medications   clindamycin  (CLEOCIN ) 2 % vaginal cream      Return in about 3 months (around 01/20/2024) for routine follow-up.  Adria Hopkins MD 10/20/2023, 2:16 PM

## 2023-10-20 NOTE — Patient Instructions (Addendum)
 Consulte GoodRx para obtener cupones para la crema de clindamicina: https://www.goodrx.com/clindesse/what-is  Le llamar para informarle [employer] de sus [employer]. Espere a recibir mi llamada antes de Corporate investment banker a usar la crema vaginal.  Recuerde traer [employer] que toma (incluidos los de venta libre y los suplementos) a cada visita a Glass blower/designer.  Este resumen posterior a la visita es una revisin importante de las pruebas, las derivaciones y los cambios de medicacin que se comentaron durante su visita. Si tiene alguna pregunta o inquietud, llame al [phone]. Fuera del horario de atencin de Glass blower/designer, llame al hospital principal al [phone] y pregunte a la operadora por el residente de [place] interna de [place].  Adria Hopkins MD 13/10/2023, 9:20 AM

## 2023-10-20 NOTE — Assessment & Plan Note (Signed)
 She wonders if this could be a side effect of metronidazole .  She sleeps poorly and this is chronic.  Paucity of systemic signs or symptoms, she appears well overall.  Lab workup today will include CBC and CMP.

## 2023-10-20 NOTE — Assessment & Plan Note (Signed)
 This could be UTI or persistent bacterial vaginosis although the latter is less likely as her discharge is improved.  External vaginal exam is normal, no lesions to suggest HSV.  UA today, I will send the intravaginal clindamycin  to pharmacy in case UA is negative in which case I will treat for persistent BV.

## 2023-10-21 ENCOUNTER — Ambulatory Visit: Payer: Self-pay | Admitting: Student

## 2023-10-21 DIAGNOSIS — R1011 Right upper quadrant pain: Secondary | ICD-10-CM

## 2023-10-21 LAB — URINALYSIS, ROUTINE W REFLEX MICROSCOPIC
Bilirubin, UA: NEGATIVE
Glucose, UA: NEGATIVE
Ketones, UA: NEGATIVE
Leukocytes,UA: NEGATIVE
Nitrite, UA: NEGATIVE
Protein,UA: NEGATIVE
RBC, UA: NEGATIVE
Specific Gravity, UA: 1.024 (ref 1.005–1.030)
Urobilinogen, Ur: 0.2 mg/dL (ref 0.2–1.0)
pH, UA: 5.5 (ref 5.0–7.5)

## 2023-10-21 LAB — CBC WITH DIFFERENTIAL/PLATELET

## 2023-10-21 NOTE — Progress Notes (Signed)
 Internal Medicine Clinic Attending  Case discussed with the resident at the time of the visit.  We reviewed the resident's history and exam and pertinent patient test results.  I agree with the assessment, diagnosis, and plan of care documented in the resident's note.

## 2023-10-22 ENCOUNTER — Other Ambulatory Visit (HOSPITAL_COMMUNITY): Payer: Self-pay

## 2023-10-22 LAB — CBC WITH DIFFERENTIAL/PLATELET
Basos: 1 %
EOS (ABSOLUTE): 0.1 10*3/uL (ref 0.0–0.2)
Eos: 3 %
Hematocrit: 40.4 % (ref 34.0–46.6)
Hemoglobin: 12.8 g/dL (ref 11.1–15.9)
Immature Granulocytes: 0 %
Immature Granulocytes: 0 10*3/uL (ref 0.0–0.1)
Lymphs: 34 %
MCH: 29.3 pg (ref 26.6–33.0)
MCHC: 31.7 g/dL (ref 31.5–35.7)
MCV: 92 fL (ref 79–97)
Monocytes Absolute: 0.2 10*3/uL (ref 0.0–0.4)
Monocytes Absolute: 0.4 10*3/uL (ref 0.1–0.9)
Monocytes: 5 %
Neutrophils Absolute: 2.6 10*3/uL (ref 0.7–3.1)
Neutrophils Absolute: 4.5 10*3/uL (ref 1.4–7.0)
Neutrophils: 57 %
Platelets: 314 10*3/uL (ref 150–450)
RBC: 4.37 x10E6/uL (ref 3.77–5.28)
RDW: 13.5 % (ref 11.7–15.4)
WBC: 7.6 10*3/uL (ref 3.4–10.8)

## 2023-10-22 LAB — CMP14 + ANION GAP
ALT: 38 IU/L — ABNORMAL HIGH (ref 0–32)
AST: 29 IU/L (ref 0–40)
Albumin: 4.2 g/dL (ref 3.8–4.9)
Alkaline Phosphatase: 188 IU/L — ABNORMAL HIGH (ref 44–121)
Anion Gap: 15 mmol/L (ref 10.0–18.0)
BUN/Creatinine Ratio: 25 — ABNORMAL HIGH (ref 9–23)
BUN: 17 mg/dL (ref 6–24)
Bilirubin Total: <0.2 mg/dL (ref 0.0–1.2)
CO2: 23 mmol/L (ref 20–29)
Calcium: 9.4 mg/dL (ref 8.7–10.2)
Chloride: 104 mmol/L (ref 96–106)
Creatinine, Ser: 0.68 mg/dL (ref 0.57–1.00)
Globulin, Total: 3.2 g/dL (ref 1.5–4.5)
Glucose: 96 mg/dL (ref 70–99)
Potassium: 4.3 mmol/L (ref 3.5–5.2)
Sodium: 142 mmol/L (ref 134–144)
Total Protein: 7.4 g/dL (ref 6.0–8.5)
eGFR: 103 mL/min/{1.73_m2} (ref >59)

## 2023-10-26 ENCOUNTER — Other Ambulatory Visit (HOSPITAL_COMMUNITY): Payer: Self-pay

## 2023-10-26 ENCOUNTER — Telehealth: Payer: Self-pay | Admitting: *Deleted

## 2023-10-26 NOTE — Telephone Encounter (Signed)
 Call to [facility].  Prescription has not been transferred.  [facility] will need to call.. Call to [facility] on [facility] [address] [employer].  Prescription for the Clindamycin  2% Vaginal Creme was called to the [facility] per order of Dr. Abner Hoffman.  Patient was informed that the prescription has been called to the [facility] on [address] [facility] and to give the Pharmacy a couple of hours to fill and get ready for pick up. Patient voiced understanding of the plan.  Copied from CRM [id number]. Topic: Clinical - Prescription Issue >> Oct 26, 2023  2:51 PM Brittney F wrote: Reason for CRM:   Medication In question: clindamycin  (CLEOCIN ) 2 % vaginal cream  Patient has requested this medication be sent to [facility] listed below. Patient stated the prescription has not been sent over and she is still experiencing symptoms due to not being able to treat the issue. Please resubmit prescription to [facility] and contact patient once it is done.     Pharmacy: [id number] [place], [place] - [phone] AT [facility] OF [facility] & [address] [place] [address] Phone: [phone] Fax: [phone] Hours: Not open 24 hours    Callback Number: [id number]

## 2023-10-26 NOTE — Telephone Encounter (Signed)
 Will forward to [facility] Copied from CRM [id number]. Topic: Clinical - Lab/Test Results >> Oct 22, 2023  2:09 PM Lori Murray wrote: Reason for CRM: [id number] (M)Patient stated that she received a message or phone call from the provider needing her to follow up after the labs were completed. She needed to speak with someone about the results so she could get a clear understanding.

## 2023-10-27 ENCOUNTER — Other Ambulatory Visit: Payer: Self-pay

## 2023-10-27 ENCOUNTER — Other Ambulatory Visit: Payer: Self-pay | Admitting: Student

## 2023-10-27 ENCOUNTER — Other Ambulatory Visit (HOSPITAL_COMMUNITY): Payer: Self-pay

## 2023-10-27 DIAGNOSIS — B9689 Other specified bacterial agents as the cause of diseases classified elsewhere: Secondary | ICD-10-CM

## 2023-10-27 MED ORDER — CLINDAMYCIN PHOSPHATE 2 % VA CREA
1.0000 | TOPICAL_CREAM | Freq: Every day | VAGINAL | 0 refills | Status: AC
Start: 2023-10-27 — End: 2023-11-03

## 2023-10-27 NOTE — Telephone Encounter (Signed)
 Done

## 2023-10-28 ENCOUNTER — Other Ambulatory Visit: Payer: Self-pay

## 2023-10-30 NOTE — Progress Notes (Signed)
 Seen for abdominal pain by Dr. Abner Hoffman. Tbili WNL, LFTs elevated. )Pt aware. Sent order for RUQ US .

## 2023-11-03 ENCOUNTER — Other Ambulatory Visit: Payer: Self-pay

## 2023-11-04 ENCOUNTER — Ambulatory Visit: Payer: Self-pay | Admitting: Student

## 2023-12-24 ENCOUNTER — Encounter: Payer: Self-pay | Admitting: Student

## 2023-12-24 ENCOUNTER — Other Ambulatory Visit: Payer: Self-pay

## 2023-12-24 ENCOUNTER — Ambulatory Visit: Payer: Self-pay

## 2023-12-24 ENCOUNTER — Ambulatory Visit: Admitting: Student

## 2023-12-24 ENCOUNTER — Other Ambulatory Visit (HOSPITAL_COMMUNITY): Payer: Self-pay

## 2023-12-24 VITALS — BP 141/86 | HR 66 | Temp 97.9°F | Ht 62.0 in | Wt 166.4 lb

## 2023-12-24 DIAGNOSIS — H6691 Otitis media, unspecified, right ear: Secondary | ICD-10-CM

## 2023-12-24 DIAGNOSIS — H6690 Otitis media, unspecified, unspecified ear: Secondary | ICD-10-CM

## 2023-12-24 MED ORDER — AMOXICILLIN-POT CLAVULANATE 875-125 MG PO TABS
1.0000 | ORAL_TABLET | Freq: Two times a day (BID) | ORAL | 0 refills | Status: DC
  Filled 2023-12-24: qty 10, 5d supply, fill #0

## 2023-12-24 NOTE — Patient Instructions (Signed)
 Thank you so much for coming to the clinic today!   I think you have an ear infection, I am sending in some antibiotics to help with the symptoms. If your pain and symptoms do not get better please come back and see us  or give us  a call.   If you have any questions please feel free to the call the clinic at anytime at [phone]. It was a pleasure seeing you!  Best, Dr. Jazlin Tapscott

## 2023-12-24 NOTE — Telephone Encounter (Signed)
 FYI Only or Action Required?: FYI only for provider.  Patient was last seen in primary care on 10/20/2023 by Norrine Sharper, MD.  Called Nurse Triage reporting Otalgia.  Symptoms began several days ago.  Interventions attempted: OTC medications: Nyquil x  6 days.  Symptoms are: gradually worsening.  Triage Disposition: See HCP Within 4 Hours (Or PCP Triage)  Patient/caregiver understands and will follow disposition?: Yes  Pt states had cold sx last Wednesday and has some improved but R ear pain and sore throat worse. Scheduled appt today at 215.   Copied from CRM [id number]. Topic: Clinical - Red Word Triage >> Dec 24, 2023 10:23 AM Mercer PEDLAR wrote: Red Word that prompted transfer to Nurse Triage: Bad ear and throat pain. Reason for Disposition  [1] SEVERE pain (e.g., excruciating) and [2] not improved 2 hours after pain medicine (e.g., acetaminophen  or ibuprofen )  Answer Assessment - Initial Assessment Questions 1. LOCATION: Which ear is involved?     R ear  2. ONSET: When did the ear pain start?      Last week  3. SEVERITY: How bad is the pain?  (Scale 1-10; mild, moderate or severe)     10/10 4. URI SYMPTOMS: Do you have a runny nose or cough?     yes 5. FEVER: Do you have a fever? If Yes, ask: What is your temperature, how was it measured, and when did it start?     no 7. OTHER SYMPTOMS: Do you have any other symptoms? (e.g., decreased hearing, dizziness, headache, stiff neck, vomiting)     Throat pain, chills  Nyquil for sx x 6 days  Protocols used: Earache-A-AH

## 2023-12-24 NOTE — Telephone Encounter (Signed)
 Pt has an appt today 7/17 with Dr Nooruddin.

## 2023-12-25 DIAGNOSIS — H6690 Otitis media, unspecified, unspecified ear: Secondary | ICD-10-CM | POA: Insufficient documentation

## 2023-12-25 NOTE — Assessment & Plan Note (Addendum)
 Pt presents with a ten day history of right ear pain. She initially had a cough, and sore throat as well however this had subsided. She reports no hearing loss, but a dull aching pain that just hasn't relieved. She does endorse some yellow drainage from the ear when she first got sick, however this has also resolved. On my exam, her tympanic membrane is bulging with some fluid in there as well. Will treat as otitis media, and give course of augmentin .

## 2023-12-25 NOTE — Progress Notes (Signed)
 CC: Right ear pain  HPI:  [name] is a 53 y.o. female living with a history stated below and presents today for right ear pain. Please see problem based assessment and plan for additional details.  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 followed at [facility] center; LUL ptosis 2/2 Horner's; f/u with Dr. Valda Plant 07/06/2020   Metrorrhagia    h/o treated with OCP.   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      Upper respiratory infection 02/02/2021   Urinary incontinence 06/12/2017   Xerostomia 03/07/2010   Seen by Dr. Valen at [facility] on 03/21/10- could have mild Sjogren Syndrome with weakly positive anti - SSA antibody and ANA but was not confirmed by lip biopsy.      Current Outpatient Medications on File Prior to Visit  Medication Sig Dispense Refill   atorvastatin  (LIPITOR) 40 MG tablet Take 1 tablet (40 mg total) by mouth daily. 90 tablet 0   ezetimibe  (ZETIA ) 10 MG tablet Take 1 tablet (10 mg total) by mouth daily. 90 tablet 3   pilocarpine  (SALAGEN ) 7.5 MG tablet Take 1 tablet (7.5 mg total) by mouth at bedtime. 30 tablet 2   sertraline  (ZOLOFT ) 50 MG tablet Take 1 tablet (50 mg total) by mouth daily. 90 tablet 2   No current facility-administered  medications on file prior to visit.    Family History  Problem Relation Age of Onset   Diabetes Neg Hx    Hypertension Neg Hx    Depression Neg Hx     Social History   Socioeconomic History   Marital status: Legally Separated    Spouse name: Not on file   Number of children: Not on file   Years of education: Not on file   Highest education level: Not on file  Occupational History   Not on file  Tobacco Use   Smoking status: Former    Current packs/day: 0.00    Types: Cigarettes    Quit date: 07/24/2017    Years since quitting: 6.4   Smokeless tobacco: Never  Substance and Sexual Activity   Alcohol use: No   Drug use: No   Sexual activity: Yes    Birth control/protection: None  Other Topics Concern   Not on file  Social History Narrative   ** Merged History Encounter **       Regular exercise- yes.   Social Drivers of Health   Financial Resource Strain: Medium Risk (12/24/2023)   Overall Financial Resource Strain (CARDIA)    Difficulty of Paying Living Expenses: Somewhat hard  Food Insecurity: No Food Insecurity (12/24/2023)   Hunger Vital Sign    Worried About Running Out of Food in  the Last Year: Never true    Ran Out of Food in the Last Year: Never true  Transportation Needs: Not on file  Physical Activity: Sufficiently Active (12/24/2023)   Exercise Vital Sign    Days of Exercise per Week: 3 days    Minutes of Exercise per Session: 60 min  Stress: No Stress Concern Present (12/24/2023)   [employer] of Occupational Health - Occupational Stress Questionnaire    Feeling of Stress: Only a little  Social Connections: Socially Isolated (12/24/2023)   Social Connection and Isolation Panel    Frequency of Communication with Friends and Family: More than three times a week    Frequency of Social Gatherings with Friends and Family: More than three times a week    Attends Religious Services: Patient unable to answer    Active Member of Clubs or Organizations: No     Attends Banker Meetings: Never    Marital Status: Separated  Intimate Partner Violence: Not At Risk (12/24/2023)   Humiliation, Afraid, Rape, and Kick questionnaire    Fear of Current or Ex-Partner: No    Emotionally Abused: No    Physically Abused: No    Sexually Abused: No    Review of Systems: ROS negative except for what is noted on the assessment and plan.  Vitals:   12/24/23 1405 12/24/23 1411  BP: (!) 144/93 (!) 141/86  Pulse: 66 66  Temp: 97.9 F (36.6 C)   TempSrc: Oral   SpO2: 99%   Weight: 166 lb 6.4 oz (75.5 kg)   Height: 5' 2 (1.575 m)     Physical Exam: Constitutional: well-appearing female  in no acute distress HENT: normocephalic atraumatic, mucous membranes moist Eyes: conjunctiva non-erythematous Ears: Right ear tender on insertion of otoscope, bulging tympanic membrane with some fluid Neck: supple Cardiovascular: regular rate and rhythm, no m/r/g Pulmonary/Chest: normal work of breathing on room air, lungs clear to auscultation bilaterally   Assessment & Plan:   Acute otitis media Pt presents with a ten day history of right ear pain. She initially had a cough, and sore throat as well however this had subsided. She reports no hearing loss, but a dull aching pain that just hasn't relieved. She does endorse some yellow drainage from the ear when she first got sick, however this has also resolved. On my exam, her tympanic membrane is bulging with some fluid in there as well. Will treat as otitis media, and give course of augmentin .   Patient discussed with Dr. Narendra  Lori Murray, M.D. [facility] Internal Medicine, PGY-3 Pager: [phone] Date 12/26/2023 Time 1:24 PM

## 2023-12-28 ENCOUNTER — Other Ambulatory Visit: Payer: Self-pay

## 2023-12-31 ENCOUNTER — Other Ambulatory Visit (HOSPITAL_COMMUNITY)
Admission: RE | Admit: 2023-12-31 | Discharge: 2023-12-31 | Disposition: A | Discharge status: Home / Self Care | Source: Ambulatory Visit | Attending: Family Medicine | Admitting: Family Medicine

## 2023-12-31 ENCOUNTER — Ambulatory Visit

## 2023-12-31 ENCOUNTER — Ambulatory Visit (HOSPITAL_COMMUNITY)
Admission: RE | Admit: 2023-12-31 | Discharge: 2023-12-31 | Disposition: A | Discharge status: Home / Self Care | Source: Ambulatory Visit | Attending: Internal Medicine | Admitting: Internal Medicine

## 2023-12-31 ENCOUNTER — Other Ambulatory Visit: Payer: Self-pay

## 2023-12-31 ENCOUNTER — Other Ambulatory Visit: Payer: Self-pay | Admitting: Student

## 2023-12-31 VITALS — BP 129/93 | HR 70 | Temp 98.0°F | Ht 62.0 in | Wt 167.2 lb

## 2023-12-31 DIAGNOSIS — H6690 Otitis media, unspecified, unspecified ear: Secondary | ICD-10-CM

## 2023-12-31 DIAGNOSIS — R1011 Right upper quadrant pain: Secondary | ICD-10-CM | POA: Insufficient documentation

## 2023-12-31 DIAGNOSIS — H6691 Otitis media, unspecified, right ear: Secondary | ICD-10-CM | POA: Diagnosis present

## 2023-12-31 DIAGNOSIS — N898 Other specified noninflammatory disorders of vagina: Secondary | ICD-10-CM | POA: Diagnosis not present

## 2023-12-31 DIAGNOSIS — E785 Hyperlipidemia, unspecified: Secondary | ICD-10-CM | POA: Diagnosis not present

## 2023-12-31 DIAGNOSIS — R7303 Prediabetes: Secondary | ICD-10-CM

## 2023-12-31 MED ORDER — FLUTICASONE PROPIONATE 50 MCG/ACT NA SUSP: 1.0000 | Freq: Every day | NASAL | 0 refills | Status: DC

## 2023-12-31 MED ORDER — GUAIFENESIN ER 600 MG PO TB12: 600.0000 mg | ORAL_TABLET | Freq: Two times a day (BID) | ORAL | 0 refills | Status: DC | PRN

## 2023-12-31 NOTE — Assessment & Plan Note (Addendum)
 On 11/26/2021, the patient was started on atorvastatin  40 mg due to an LDL >190 and an ASCVD risk score of 5.4. On 02/11/2023, her LDL had improved to 124, and ezetimibe  10 mg was added.  Today, the patient reports she has stopped taking both atorvastatin  and ezetimibe  due to side effects, specifically muscle cramps, and a general reluctance to take multiple medications. She has become more disciplined with her diet and exercise routine. Most recent ASCVD score, based on last lipid panel and HDL is 2.4.  Plan:  Repeat lipid panel to reassess therapy hemoglobin A1c

## 2023-12-31 NOTE — Progress Notes (Signed)
 Internal Medicine Clinic Attending  I was physically present during the key portions of the resident provided service and participated in the medical decision making of patient's management care. I reviewed pertinent patient test results.  The assessment, diagnosis, and plan were formulated together and I agree with the documentation in the resident's note.  Cherylene Corrente, MD

## 2023-12-31 NOTE — Patient Instructions (Addendum)
 Thank you, Ms. Lori Murray, for allowing us  to be part of your care today.  We discussed your symptoms, including nasal congestion and cough, which appear to be related to postnasal drip following a recent upper respiratory infection. We started you on Flonase  and Mucinex  to help reduce the discharge and improve your symptoms.  We also addressed your vaginal itching, and you completed a self-swab for testing. We'll wait for the results and start treatment if needed.  Additionally, we'll check your lipid panel and hemoglobin A1c. Once we have those results, we can decide together whether to continue or hold your atorvastatin  and ezetimibe  based on your current levels.  It was a pleasure seeing you today. Please don't hesitate to reach out if you have any questions or concerns.  I have ordered the following labs for you:  Lab Orders         Lipid Profile         Hemoglobin A1c        Referrals ordered today:   Referral Orders  No referral(s) requested today     I have ordered the following medication/changed the following medications:   Stop the following medications: There are no discontinued medications.   Start the following medications: Meds ordered this encounter  Medications   fluticasone  (FLONASE ) 50 MCG/ACT nasal spray    Sig: Place 1 spray into both nostrils daily.    Dispense:  10 mL    Refill:  0   guaiFENesin  (MUCINEX ) 600 MG 12 hr tablet    Sig: Take 1 tablet (600 mg total) by mouth 2 (two) times daily as needed.    Dispense:  20 tablet    Refill:  0     Follow up: 6 months   Remember:   Should you have any questions or concerns please call the internal medicine clinic at [phone].    Armando Rossetti, M.D [facility]

## 2023-12-31 NOTE — Addendum Note (Signed)
 Addended by: Orlena Garmon on: 12/31/2023 11:43 AM   Modules accepted: Orders

## 2023-12-31 NOTE — Assessment & Plan Note (Addendum)
 She reports having a productive cough with thick mucus and nasal congestion, especially at night, following a cold. Her right ear pain has improved, but there is still some discharge. She has no fever and is overall feeling better. On exam, her right eardrum appeared bulging with some white discharge in the ear canal. Lung exam was normal with no wheezing or crackles.  Assessment: PND and post viral URI cough Plan: Start Flonase  nasal spray, guaifenesin  (Mucinex ), saline nasal washes

## 2023-12-31 NOTE — Progress Notes (Signed)
 CC: vaginal itching and postnasal discharge  HPI:  Lori Murray is a 53 year old female with the medical history noted below, presenting today with complaints of vaginal itching and postnasal discharge. Please see problem based assessment and plan for additional details.  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 followed at [facility] center; LUL ptosis 2/2 Horner's; f/u with Dr. Valda Plant 07/06/2020   Metrorrhagia    h/o treated with OCP.   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      Upper respiratory infection 02/02/2021   Urinary incontinence 06/12/2017   Xerostomia 03/07/2010   Seen by Dr. Valen at [facility] on 03/21/10- could have mild Sjogren Syndrome with weakly positive anti - SSA antibody and ANA but was not confirmed by lip biopsy.      Current Outpatient Medications on File Prior to Visit  Medication Sig Dispense Refill   amoxicillin -clavulanate (AUGMENTIN ) 875-125 MG tablet Take 1 tablet by mouth 2 (two) times daily. 10 tablet 0   atorvastatin  (LIPITOR) 40 MG tablet Take 1 tablet (40 mg total) by mouth daily. 90 tablet 0   ezetimibe  (ZETIA ) 10 MG tablet Take 1 tablet (10 mg total) by mouth daily. 90 tablet 3   pilocarpine  (SALAGEN ) 7.5 MG tablet Take 1  tablet (7.5 mg total) by mouth at bedtime. 30 tablet 2   sertraline  (ZOLOFT ) 50 MG tablet Take 1 tablet (50 mg total) by mouth daily. 90 tablet 2   No current facility-administered medications on file prior to visit.    Family History  Problem Relation Age of Onset   Diabetes Neg Hx    Hypertension Neg Hx    Depression Neg Hx     Social History   Socioeconomic History   Marital status: Legally Separated    Spouse name: Not on file   Number of children: Not on file   Years of education: Not on file   Highest education level: Not on file  Occupational History   Not on file  Tobacco Use   Smoking status: Former    Current packs/day: 0.00    Types: Cigarettes    Quit date: 07/24/2017    Years since quitting: 6.4   Smokeless tobacco: Never  Substance and Sexual Activity   Alcohol use: No   Drug use: No   Sexual activity: Yes    Birth control/protection: None  Other Topics Concern   Not on file  Social History Narrative   ** Merged History Encounter **       Regular exercise- yes.   Social Drivers of Health   Financial Resource Strain: Medium Risk (12/24/2023)   Overall Financial Resource Strain (CARDIA)    Difficulty of Paying Living Expenses:  Somewhat hard  Food Insecurity: No Food Insecurity (12/24/2023)   Hunger Vital Sign    Worried About Running Out of Food in the Last Year: Never true    Ran Out of Food in the Last Year: Never true  Transportation Needs: Not on file  Physical Activity: Sufficiently Active (12/24/2023)   Exercise Vital Sign    Days of Exercise per Week: 3 days    Minutes of Exercise per Session: 60 min  Stress: No Stress Concern Present (12/24/2023)   [employer] of Occupational Health - Occupational Stress Questionnaire    Feeling of Stress: Only a little  Social Connections: Socially Isolated (12/24/2023)   Social Connection and Isolation Panel    Frequency of Communication with Friends and Family: More than three times a week     Frequency of Social Gatherings with Friends and Family: More than three times a week    Attends Religious Services: Patient unable to answer    Active Member of Clubs or Organizations: No    Attends Banker Meetings: Never    Marital Status: Separated  Intimate Partner Violence: Not At Risk (12/24/2023)   Humiliation, Afraid, Rape, and Kick questionnaire    Fear of Current or Ex-Partner: No    Emotionally Abused: No    Physically Abused: No    Sexually Abused: No    Review of Systems: ROS negative except for what is noted on the assessment and plan.  Vitals:   12/31/23 1008  BP: (!) 129/93  Pulse: 70  Temp: 98 F (36.7 C)  TempSrc: Oral  SpO2: 94%  Weight: 167 lb 3.2 oz (75.8 kg)  Height: 5' 2 (1.575 m)    Physical Exam  Physical Exam: Constitutional: well-appearing n no acute distress HENT: normocephalic atraumatic, mucous membranes moist Eyes: conjunctiva non-erythematous Neck: supple Cardiovascular: regular rate and rhythm, no m/r/g Pulmonary/Chest: normal work of breathing on room air, lungs clear to auscultation bilaterally Abdominal: soft, non-tender, non-distended Neurological: alert & oriented x 3, 5/5 strength in bilateral upper and lower extremities, normal gait Skin: warm and dry   Assessment & Plan:   Acute otitis media She reports having a productive cough with thick mucus and nasal congestion, especially at night, following a cold. Her right ear pain has improved, but there is still some discharge. She has no fever and is overall feeling better. On exam, her right eardrum appeared bulging with some white discharge in the ear canal. Lung exam was normal with no wheezing or crackles.  Assessment: PND and post viral URI cough Plan: Start Flonase  nasal spray, guaifenesin  (Mucinex ), saline nasal washes  Itching in the vaginal area She presents with vaginal itching and burning for the past two days following a course of Augmentin . She denies any  abnormal discharge, rash, erythema. Her last sexual activity was one month ago. She has a history of TAH due to AUB with normal cervical pathology. Denies dysuria, frequency. Positive history of Bacterial Vaginosis.  Plan:  self swab for vaginitis and consider partner treatment if positive  Hyperlipidemia On 11/26/2021, the patient was started on atorvastatin  40 mg due to an LDL >190 and an ASCVD risk score of 5.4. On 02/11/2023, her LDL had improved to 124, and ezetimibe  10 mg was added.  Today, the patient reports she has stopped taking both atorvastatin  and ezetimibe  due to side effects, specifically muscle cramps, and a general reluctance to take multiple medications. She has become more disciplined with her diet and exercise routine. Most recent  ASCVD score, based on last lipid panel and HDL is 2.4.  Plan:  Repeat lipid panel to reassess therapy hemoglobin A1c    Patient seen with Dr. Francesco Armando Rossetti M.D [facility] Internal Medicine, PGY-1 Phone: [phone] Date 12/31/2023 Time 11:27 AM

## 2023-12-31 NOTE — Assessment & Plan Note (Addendum)
 She presents with vaginal itching and burning for the past two days following a course of Augmentin . She denies any abnormal discharge, rash, erythema. Her last sexual activity was one month ago. She has a history of TAH due to AUB with normal cervical pathology. Denies dysuria, frequency. Positive history of Bacterial Vaginosis.  Plan:  self swab for vaginitis and consider partner treatment if positive

## 2024-01-01 LAB — CERVICOVAGINAL ANCILLARY ONLY
Bacterial Vaginitis (gardnerella): NEGATIVE
Candida Glabrata: NEGATIVE
Candida Vaginitis: POSITIVE — AB
Chlamydia: NEGATIVE
Comment: NEGATIVE
Comment: NEGATIVE
Comment: NEGATIVE
Comment: NEGATIVE
Comment: NEGATIVE
Comment: NORMAL
Neisseria Gonorrhea: NEGATIVE
Trichomonas: NEGATIVE

## 2024-01-04 ENCOUNTER — Other Ambulatory Visit (HOSPITAL_COMMUNITY): Payer: Self-pay

## 2024-01-05 ENCOUNTER — Other Ambulatory Visit

## 2024-01-05 DIAGNOSIS — R7303 Prediabetes: Secondary | ICD-10-CM

## 2024-01-05 DIAGNOSIS — E785 Hyperlipidemia, unspecified: Secondary | ICD-10-CM

## 2024-01-06 LAB — LIPID PANEL
Chol/HDL Ratio: 5.3 ratio — ABNORMAL HIGH (ref 0.0–4.4)
Cholesterol, Total: 247 mg/dL — ABNORMAL HIGH (ref 100–199)
HDL: 47 mg/dL (ref >39)
LDL Chol Calc (NIH): 166 mg/dL — ABNORMAL HIGH (ref 0–99)
Triglycerides: 185 mg/dL — ABNORMAL HIGH (ref 0–149)
VLDL Cholesterol Cal: 34 mg/dL (ref 5–40)

## 2024-01-06 LAB — HEMOGLOBIN A1C
Est. average glucose Bld gHb Est-mCnc: 117 mg/dL
Hgb A1c MFr Bld: 5.7 % — ABNORMAL HIGH (ref 4.8–5.6)

## 2024-01-07 ENCOUNTER — Ambulatory Visit: Payer: Self-pay | Admitting: Student

## 2024-01-08 NOTE — Progress Notes (Signed)
 Internal Medicine Clinic Attending  Case discussed with the resident at the time of the visit.  We reviewed the resident's history and exam and pertinent patient test results.  I agree with the assessment, diagnosis, and plan of care documented in the resident's note.

## 2024-01-20 ENCOUNTER — Other Ambulatory Visit: Payer: Self-pay

## 2024-01-20 ENCOUNTER — Other Ambulatory Visit (HOSPITAL_COMMUNITY): Payer: Self-pay

## 2024-01-20 ENCOUNTER — Other Ambulatory Visit (HOSPITAL_BASED_OUTPATIENT_CLINIC_OR_DEPARTMENT_OTHER): Payer: Self-pay

## 2024-01-20 ENCOUNTER — Other Ambulatory Visit: Payer: Self-pay | Admitting: Student

## 2024-01-20 DIAGNOSIS — E785 Hyperlipidemia, unspecified: Secondary | ICD-10-CM

## 2024-01-20 MED ORDER — ATORVASTATIN CALCIUM 40 MG PO TABS
40.0000 mg | ORAL_TABLET | Freq: Every day | ORAL | 0 refills | Status: DC
  Filled 2024-01-20 (×3): qty 90, 90d supply, fill #0

## 2024-01-20 NOTE — Telephone Encounter (Signed)
 Medication sent to pharmacy

## 2024-01-25 ENCOUNTER — Other Ambulatory Visit: Payer: Self-pay

## 2024-01-27 ENCOUNTER — Other Ambulatory Visit (HOSPITAL_COMMUNITY): Payer: Self-pay

## 2024-01-27 ENCOUNTER — Ambulatory Visit: Admitting: Student

## 2024-01-27 VITALS — BP 130/83 | HR 98 | Temp 98.1°F | Ht 62.0 in | Wt 166.4 lb

## 2024-01-27 DIAGNOSIS — E663 Overweight: Secondary | ICD-10-CM | POA: Insufficient documentation

## 2024-01-27 DIAGNOSIS — E669 Obesity, unspecified: Secondary | ICD-10-CM

## 2024-01-27 DIAGNOSIS — Z6830 Body mass index (BMI) 30.0-30.9, adult: Secondary | ICD-10-CM

## 2024-01-27 DIAGNOSIS — R748 Abnormal levels of other serum enzymes: Secondary | ICD-10-CM | POA: Diagnosis not present

## 2024-01-27 DIAGNOSIS — K760 Fatty (change of) liver, not elsewhere classified: Secondary | ICD-10-CM

## 2024-01-27 DIAGNOSIS — E785 Hyperlipidemia, unspecified: Secondary | ICD-10-CM

## 2024-01-27 DIAGNOSIS — E66811 Obesity, class 1: Secondary | ICD-10-CM | POA: Insufficient documentation

## 2024-01-27 MED ORDER — ATORVASTATIN CALCIUM 40 MG PO TABS
40.0000 mg | ORAL_TABLET | Freq: Every day | ORAL | 0 refills | Status: AC
Start: 2024-04-26 — End: ?
  Filled 2024-01-27 – 2024-04-26 (×2): qty 90, 90d supply, fill #0

## 2024-01-27 MED ORDER — ATORVASTATIN CALCIUM 40 MG PO TABS: 40.0000 mg | ORAL_TABLET | Freq: Every day | ORAL | 0 refills | Status: DC

## 2024-01-27 NOTE — Progress Notes (Unsigned)
  Patient name: Lori Murray Date of birth: Dec 26, 1970 Date of visit: 01/28/24  Subjective   Reason for visit: follow-up liver enzyme elevation  Doing well, no new concerns.  Notes interval resolution of RUQ pain reported at 10/20/23 visit.  She has lost some weight through diet and exercise.  Screened for unhealthy alcohol use, she drinks socially, probably less than once per month and has 5-6 drinks per episode.  She's working on improving her English, taking online classes for this.  Current Outpatient Medications  Medication Instructions   amoxicillin -clavulanate (AUGMENTIN ) 875-125 MG tablet 1 tablet, Oral, 2 times daily   atorvastatin  (LIPITOR) 40 mg, Oral, Daily   ezetimibe  (ZETIA ) 10 mg, Oral, Daily   fluticasone  (FLONASE ) 50 MCG/ACT nasal spray 1 spray, Each Nare, Daily   guaiFENesin  (MUCINEX ) 600 mg, Oral, 2 times daily PRN   pilocarpine  (SALAGEN ) 7.5 mg, Oral, Daily at bedtime   sertraline  (ZOLOFT ) 50 mg, Oral, Daily     Objective  Today's Vitals   01/27/24 1415  BP: 130/83  Pulse: 98  Temp: 98.1 F (36.7 C)  TempSrc: Oral  SpO2: 94%  Weight: 166 lb 6.4 oz (75.5 kg)  Height: 5' 2 (1.575 m)  Body mass index is 30.43 kg/m.   Physical Exam Constitutional:      Appearance: Normal appearance.  Cardiovascular:     Rate and Rhythm: Normal rate and regular rhythm.  Pulmonary:     Effort: Pulmonary effort is normal. No respiratory distress.  Abdominal:     Comments: No RUQ tenderness  Skin:    General: Skin is warm and dry.  Neurological:     Mental Status: She is alert.     Cranial Nerves: No facial asymmetry.  Psychiatric:        Mood and Affect: Affect normal.        Speech: Speech normal.        Behavior: Behavior normal.      Assessment & Plan   Obesity (BMI 30.0-34.9) Assessment & Plan: Body mass index is 30.43 kg/m.  I recommend eliminating sugary beverages like soda, sweet tea, and juice entirely. I recommended more vegetables,  lean protein, and legumes. Frozen vegetables are healthy and inexpensive. Beans are a healthy and inexpensive source of lean protein and fiber. I recommend gradually increasing exercise. A daily walk is a great way to start an exercise program.  6 lbs lost in 4 months. If she plateaus or gains by next visit refer to dietitian and start pharmacologic therapy given likely MASLD.    Hyperlipidemia, unspecified hyperlipidemia type Assessment & Plan: The 10-year ASCVD risk score (Arnett DK, et al., 2019) is: 3%  Continue atorvastatin  40 mg daily and ezetimibe  10 mg daily.  Orders: -     Atorvastatin  Calcium ; Take 1 tablet (40 mg total) by mouth daily.  Dispense: 90 tablet; Refill: 0  Elevated liver enzymes Assessment & Plan: Chronic x 3 months. No RUQ pain, US  ordered after last encounter shows fatty, enlarged liver. HBV and HCV seronegative. Normal platelets. Suspect MASLD. Fib 4 0.89, low risk. Continue working on weight loss. Counseled to avoid alcohol. Repeat liver enzymes when she has lost ~10% or approximately 15-20 lbs.  Orders: -     Hepatitis B surface antibody,qualitative -     Hepatitis B surface antigen -     Hepatic function panel    Return in about 3 months (around 04/28/2024).  Ozell Kung MD 01/28/2024, 4:52 PM

## 2024-01-27 NOTE — Assessment & Plan Note (Signed)
 The 10-year ASCVD risk score (Arnett DK, et al., 2019) is: 3%  Continue atorvastatin  40 mg daily.

## 2024-01-27 NOTE — Patient Instructions (Signed)
 Remember to bring all of the medications that you take (including over the counter medications and supplements) with you to every clinic visit.  This after visit summary is an important review of tests, referrals, and medication changes that were discussed during your visit. If you have questions or concerns, call [phone]. Outside of clinic business hours, call the main hospital at [phone] and ask the operator for the on-call internal medicine resident.   Ozell Kung MD 01/27/2024, 2:57 PM

## 2024-01-28 ENCOUNTER — Ambulatory Visit: Payer: Self-pay | Admitting: Student

## 2024-01-28 DIAGNOSIS — R748 Abnormal levels of other serum enzymes: Secondary | ICD-10-CM | POA: Insufficient documentation

## 2024-01-28 LAB — HEPATIC FUNCTION PANEL
ALT: 42 IU/L — ABNORMAL HIGH (ref 0–32)
AST: 33 IU/L (ref 0–40)
Albumin: 4.3 g/dL (ref 3.8–4.9)
Alkaline Phosphatase: 163 IU/L — ABNORMAL HIGH (ref 44–121)
Bilirubin Total: 0.3 mg/dL (ref 0.0–1.2)
Bilirubin, Direct: 0.12 mg/dL (ref 0.00–0.40)
Total Protein: 7.6 g/dL (ref 6.0–8.5)

## 2024-01-28 LAB — HEPATITIS B SURFACE ANTIBODY,QUALITATIVE: Hep B Surface Ab, Qual: NONREACTIVE

## 2024-01-28 LAB — HEPATITIS B SURFACE ANTIGEN: Hepatitis B Surface Ag: NEGATIVE

## 2024-01-28 NOTE — Assessment & Plan Note (Addendum)
 Chronic x 3 months. No RUQ pain, US  ordered after last encounter shows fatty, enlarged liver. HBV and HCV seronegative. Normal platelets. Suspect MASLD. Fib 4 0.89, low risk. Continue working on weight loss. Counseled to avoid alcohol. Repeat liver enzymes when she has lost ~10% or approximately 15-20 lbs.

## 2024-01-28 NOTE — Assessment & Plan Note (Addendum)
 Body mass index is 30.43 kg/m.  I recommend eliminating sugary beverages like soda, sweet tea, and juice entirely. I recommended more vegetables, lean protein, and legumes. Frozen vegetables are healthy and inexpensive. Beans are a healthy and inexpensive source of lean protein and fiber. I recommend gradually increasing exercise. A daily walk is a great way to start an exercise program.  6 lbs lost in 4 months. If she plateaus or gains by next visit refer to dietitian and start pharmacologic therapy given likely MASLD.

## 2024-01-31 NOTE — Progress Notes (Signed)
 Internal Medicine Clinic Attending  Case discussed with the resident at the time of the visit.  We reviewed the resident's history and exam and pertinent patient test results.  I agree with the assessment, diagnosis, and plan of care documented in the resident's note.

## 2024-02-24 ENCOUNTER — Other Ambulatory Visit: Payer: Self-pay | Admitting: Student

## 2024-02-24 ENCOUNTER — Telehealth: Payer: Self-pay | Admitting: Student

## 2024-02-24 DIAGNOSIS — Z1231 Encounter for screening mammogram for malignant neoplasm of breast: Secondary | ICD-10-CM

## 2024-02-24 NOTE — Telephone Encounter (Signed)
 PT calling to request a New order for a Mammogram.  Please place a new order and we will reach out to the patient.

## 2024-02-29 ENCOUNTER — Telehealth: Payer: Self-pay | Admitting: *Deleted

## 2024-02-29 NOTE — Telephone Encounter (Signed)
 Mammogram appointment (already scheduled) 03-16-2024

## 2024-03-01 ENCOUNTER — Other Ambulatory Visit: Payer: Self-pay | Admitting: Medical Genetics

## 2024-03-16 ENCOUNTER — Ambulatory Visit
Admission: RE | Admit: 2024-03-16 | Discharge: 2024-03-16 | Disposition: A | Discharge status: Home / Self Care | Source: Ambulatory Visit | Attending: Internal Medicine | Admitting: Internal Medicine

## 2024-03-16 DIAGNOSIS — Z1231 Encounter for screening mammogram for malignant neoplasm of breast: Secondary | ICD-10-CM

## 2024-04-03 ENCOUNTER — Other Ambulatory Visit: Payer: Self-pay | Admitting: Student

## 2024-04-03 DIAGNOSIS — K117 Disturbances of salivary secretion: Secondary | ICD-10-CM

## 2024-04-04 ENCOUNTER — Other Ambulatory Visit (HOSPITAL_BASED_OUTPATIENT_CLINIC_OR_DEPARTMENT_OTHER): Payer: Self-pay

## 2024-04-04 ENCOUNTER — Other Ambulatory Visit: Payer: Self-pay

## 2024-04-04 ENCOUNTER — Other Ambulatory Visit (HOSPITAL_COMMUNITY): Payer: Self-pay

## 2024-04-04 MED ORDER — PILOCARPINE HCL 7.5 MG PO TABS
7.5000 mg | ORAL_TABLET | Freq: Every day | ORAL | 2 refills | Status: AC
  Filled 2024-04-04: qty 30, 30d supply, fill #0
  Filled 2024-04-26: qty 30, 30d supply, fill #1
  Filled 2024-06-12: qty 30, 30d supply, fill #2

## 2024-04-22 ENCOUNTER — Other Ambulatory Visit (HOSPITAL_COMMUNITY)

## 2024-04-26 ENCOUNTER — Other Ambulatory Visit: Payer: Self-pay

## 2024-04-26 ENCOUNTER — Other Ambulatory Visit (HOSPITAL_COMMUNITY): Payer: Self-pay

## 2024-04-27 ENCOUNTER — Ambulatory Visit: Admitting: Student

## 2024-04-27 VITALS — BP 121/86 | HR 62 | Temp 97.4°F | Ht 62.0 in | Wt 162.2 lb

## 2024-04-27 DIAGNOSIS — R7303 Prediabetes: Secondary | ICD-10-CM

## 2024-04-27 DIAGNOSIS — E663 Overweight: Secondary | ICD-10-CM

## 2024-04-27 DIAGNOSIS — E785 Hyperlipidemia, unspecified: Secondary | ICD-10-CM

## 2024-04-27 NOTE — Assessment & Plan Note (Signed)
 The 10-year ASCVD risk score (Arnett DK, et al., 2019) is: 2.6%   Values used to calculate the score:     Age: 53 years     Clincally relevant sex: Female     Is Non-Hispanic African American: No     Diabetic: No     Tobacco smoker: No     Systolic Blood Pressure: 121 mmHg     Is BP treated: No     HDL Cholesterol: 47 mg/dL     Total Cholesterol: 247 mg/dL   Low 89-bzjm ASCVD risk.  Continue atorvastatin  40 mg daily and Zetia  10 mg daily.  Recheck lipid panel in the office today

## 2024-04-27 NOTE — Patient Instructions (Signed)
 Estimada Sra. [name] por permitirnos brindarle atencin hoy. Revisamos su salud general y engineer, structural felicitarla por el progreso que ha logrado en su camino de prdida de peso: excelente trabajo!  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 Por favor, contine tomando Lipitor y Zetia  segn lo prescrito.  Su hemoglobina A1c estaba en un nivel aceptable durante su ltimo control. Planeamos revisarla nuevamente en su prxima visita.  Si tiene alguna pregunta o inquietud [place] tanto, no dude en comunicarse con nosotros.     Dear [name], Thank you for allowing us  to provide your care today. We reviewed your overall health, and I want to congratulate you on the progress you've made on your weight-loss journey--great work! Today, we rechecked your lipid panel. Please continue taking your Lipitor and Zetia  as prescribed. Your hemoglobin A1c was at an acceptable level during your last check. We will plan to recheck it at your next visit. If you have any questions or concerns in the meantime, please feel free to reach out.  I have ordered the following labs for you:  Lab Orders  No laboratory test(s) ordered today     Tests ordered today:    Referrals ordered today:   Referral Orders  No referral(s) requested today     I have ordered the following medication/changed the following medications:   Stop the following medications: There are no discontinued medications.   Start the following medications: No orders of the defined types were placed in this encounter.    Follow up: 6 months    Should you have any questions or concerns please call the internal medicine clinic at [phone].   Lori Lisa Grow MD 04/27/2024, 10:22 AM   [facility]

## 2024-04-27 NOTE — Progress Notes (Signed)
 CC: Routine office visit    HPI:  [name] is a 53 y.o. female living with a history stated below and presents today for routine office visit and discuss other chronic conditions.. Please see problem based assessment and plan for additional details.  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 followed at [facility] center; LUL ptosis 2/2 Horner's; f/u with Dr. Valda Plant 07/06/2020   Metrorrhagia    h/o treated with OCP.   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      Upper respiratory infection 02/02/2021   Urinary incontinence 06/12/2017   Xerostomia 03/07/2010   Seen by Dr. Valen at [facility] on 03/21/10- could have mild Sjogren Syndrome with weakly positive anti - SSA antibody and ANA but was not confirmed by lip biopsy.      Current Outpatient Medications on File Prior to Visit  Medication Sig Dispense Refill   atorvastatin  (LIPITOR) 40 MG tablet Take 1 tablet (40 mg total) by mouth daily. 90 tablet 0   ezetimibe  (ZETIA ) 10 MG tablet Take 1 tablet (10 mg total) by mouth daily. 90 tablet 3   pilocarpine  (SALAGEN ) 7.5 MG tablet Take 1 tablet (7.5 mg total) by mouth at bedtime. 30 tablet 2   sertraline  (ZOLOFT ) 50 MG tablet Take 1 tablet (50 mg total) by mouth  daily. 90 tablet 2   No current facility-administered medications on file prior to visit.    Family History  Problem Relation Age of Onset   Diabetes Neg Hx    Hypertension Neg Hx    Depression Neg Hx    Breast cancer Neg Hx     Social History   Socioeconomic History   Marital status: Legally Separated    Spouse name: Not on file   Number of children: Not on file   Years of education: Not on file   Highest education level: Not on file  Occupational History   Not on file  Tobacco Use   Smoking status: Former    Current packs/day: 0.00    Types: Cigarettes    Quit date: 07/24/2017    Years since quitting: 6.7   Smokeless tobacco: Never  Substance and Sexual Activity   Alcohol use: No   Drug use: No   Sexual activity: Yes    Birth control/protection: None  Other Topics Concern   Not on file  Social History Narrative   ** Merged History Encounter **       Regular exercise- yes.   Social Drivers of Health   Financial Resource Strain: Medium Risk (12/24/2023)   Overall Financial Resource Strain (CARDIA)    Difficulty of Paying Living Expenses: Somewhat hard  Food Insecurity: No Food Insecurity (12/24/2023)  Hunger Vital Sign    Worried About Running Out of Food in the Last Year: Never true    Ran Out of Food in the Last Year: Never true  Transportation Needs: Not on file  Physical Activity: Sufficiently Active (12/24/2023)   Exercise Vital Sign    Days of Exercise per Week: 3 days    Minutes of Exercise per Session: 60 min  Stress: No Stress Concern Present (12/24/2023)   [employer] of Occupational Health - Occupational Stress Questionnaire    Feeling of Stress: Only a little  Social Connections: Socially Isolated (12/24/2023)   Social Connection and Isolation Panel    Frequency of Communication with Friends and Family: More than three times a week    Frequency of Social Gatherings with Friends and Family: More than three times a week    Attends Religious  Services: Patient unable to answer    Active Member of Clubs or Organizations: No    Attends Banker Meetings: Never    Marital Status: Separated  Intimate Partner Violence: Not At Risk (12/24/2023)   Humiliation, Afraid, Rape, and Kick questionnaire    Fear of Current or Ex-Partner: No    Emotionally Abused: No    Physically Abused: No    Sexually Abused: No    Review of Systems: ROS negative except for what is noted on the assessment and plan.  Vitals:   04/27/24 0931  BP: 121/86  Pulse: 62  Temp: (!) 97.4 F (36.3 C)  TempSrc: Oral  SpO2: 96%  Weight: 162 lb 3.2 oz (73.6 kg)  Height: 5' 2 (1.575 m)    Physical Exam: Well-appearing, alert, and oriented woman in no acute distress; normocephalic, atraumatic, with moist mucous membranes; conjunctiva non-erythematous; cardiovascular exam reveals regular rate and rhythm without murmurs, rubs, or gallops; lungs clear with normal work of breathing on room air; abdomen soft, non-tender, non-distended; normal muscle bulk and tone; skin warm and dry; and normal mood and behavior.   Assessment & Plan:   Hyperlipidemia The 10-year ASCVD risk score (Arnett DK, et al., 2019) is: 2.6%   Values used to calculate the score:     Age: 51 years     Clincally relevant sex: Female     Is Non-Hispanic African American: No     Diabetic: No     Tobacco smoker: No     Systolic Blood Pressure: 121 mmHg     Is BP treated: No     HDL Cholesterol: 47 mg/dL     Total Cholesterol: 247 mg/dL   Low 89-bzjm ASCVD risk.  Continue atorvastatin  40 mg daily and Zetia  10 mg daily.  Recheck lipid panel in the office today  Prediabetes The patient's weight and overall health are well-controlled with lifestyle and dietary modifications. She has successfully lost weight, decreasing from 172 lbs to 162 lbs over the past year. At this time, there is no urgent indication for medical therapy.   Overweight (BMI 25.0-29.9) At her last visit, the  patient was classified as Obesity Class I. With her recent weight loss, her BMI is now 29.6, which places her in the overweight category. She no longer meets criteria for an obesity diagnosis. Plan: Continue to encourage lifestyle modifications and dietary changes to maintain weight loss and overall health.     Patient discussed with Dr. Trudy Drue Grow, M.D [facility] Phone: [phone] Date 04/27/2024 Time 11:37 AM

## 2024-04-27 NOTE — Assessment & Plan Note (Signed)
 The patient's weight and overall health are well-controlled with lifestyle and dietary modifications. She has successfully lost weight, decreasing from 172 lbs to 162 lbs over the past year. At this time, there is no urgent indication for medical therapy.

## 2024-04-27 NOTE — Assessment & Plan Note (Signed)
 At her last visit, the patient was classified as Obesity Class I. With her recent weight loss, her BMI is now 29.6, which places her in the overweight category. She no longer meets criteria for an obesity diagnosis. Plan: Continue to encourage lifestyle modifications and dietary changes to maintain weight loss and overall health.

## 2024-04-28 ENCOUNTER — Ambulatory Visit: Payer: Self-pay | Admitting: Student

## 2024-04-28 LAB — LIPID PANEL
Chol/HDL Ratio: 3.5 ratio (ref 0.0–4.4)
Cholesterol, Total: 163 mg/dL (ref 100–199)
HDL: 47 mg/dL (ref >39)
LDL Chol Calc (NIH): 83 mg/dL (ref 0–99)
Triglycerides: 196 mg/dL — ABNORMAL HIGH (ref 0–149)
VLDL Cholesterol Cal: 33 mg/dL (ref 5–40)

## 2024-05-17 NOTE — Progress Notes (Signed)
 Internal Medicine Clinic Attending  Case discussed with the resident at the time of the visit.  We reviewed the resident's history and exam and pertinent patient test results.  I agree with the assessment, diagnosis, and plan of care documented in the resident's note.
